# Patient Record
Sex: Female | Born: 1960
Health system: Southern US, Community
[De-identification: ages and names within clinical notes are randomized; demographics above are authoritative.]

## PROBLEM LIST (undated history)

## (undated) DIAGNOSIS — K648 Other hemorrhoids: Secondary | ICD-10-CM

## (undated) DIAGNOSIS — H9190 Unspecified hearing loss, unspecified ear: Secondary | ICD-10-CM

## (undated) HISTORY — DX: Unspecified hearing loss, unspecified ear: H91.90

## (undated) HISTORY — DX: Other hemorrhoids: K64.8

---

## 1999-06-03 ENCOUNTER — Other Ambulatory Visit: Admission: RE | Admit: 1999-06-03 | Discharge: 1999-06-03 | Payer: Self-pay | Admitting: Gynecology

## 2000-06-22 ENCOUNTER — Other Ambulatory Visit: Admission: RE | Admit: 2000-06-22 | Discharge: 2000-06-22 | Payer: Self-pay | Admitting: Gynecology

## 2001-06-24 ENCOUNTER — Other Ambulatory Visit: Admission: RE | Admit: 2001-06-24 | Discharge: 2001-06-24 | Payer: Self-pay | Admitting: Gynecology

## 2002-07-05 ENCOUNTER — Other Ambulatory Visit: Admission: RE | Admit: 2002-07-05 | Discharge: 2002-07-05 | Payer: Self-pay | Admitting: Gynecology

## 2003-08-01 ENCOUNTER — Other Ambulatory Visit: Admission: RE | Admit: 2003-08-01 | Discharge: 2003-08-01 | Payer: Self-pay | Admitting: Gynecology

## 2004-08-01 ENCOUNTER — Other Ambulatory Visit: Admission: RE | Admit: 2004-08-01 | Discharge: 2004-08-01 | Payer: Self-pay | Admitting: Gynecology

## 2005-09-01 ENCOUNTER — Other Ambulatory Visit: Admission: RE | Admit: 2005-09-01 | Discharge: 2005-09-01 | Payer: Self-pay | Admitting: Gynecology

## 2008-08-27 ENCOUNTER — Emergency Department (HOSPITAL_COMMUNITY): Admission: EM | Admit: 2008-08-27 | Discharge: 2008-08-27 | Payer: Self-pay | Admitting: Emergency Medicine
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
## 2013-10-04 ENCOUNTER — Other Ambulatory Visit: Payer: 59

## 2013-10-04 DIAGNOSIS — Z0000 Encounter for general adult medical examination without abnormal findings: Secondary | ICD-10-CM

## 2013-10-04 LAB — LIPID PANEL
Cholesterol: 191 mg/dL (ref 0–200)
Triglycerides: 73 mg/dL (ref <150)

## 2013-10-04 LAB — CBC WITH DIFFERENTIAL/PLATELET
Basophils Absolute: 0 10*3/uL (ref 0.0–0.1)
Eosinophils Relative: 4 % (ref 0–5)
HCT: 42.1 % (ref 36.0–46.0)
Lymphocytes Relative: 39 % (ref 12–46)
Lymphs Abs: 2.3 10*3/uL (ref 0.7–4.0)
MCV: 90.9 fL (ref 78.0–100.0)
Monocytes Absolute: 0.3 10*3/uL (ref 0.1–1.0)
Neutro Abs: 3 10*3/uL (ref 1.7–7.7)
RBC: 4.63 MIL/uL (ref 3.87–5.11)
RDW: 14.3 % (ref 11.5–15.5)
WBC: 5.9 10*3/uL (ref 4.0–10.5)

## 2013-10-04 LAB — COMPLETE METABOLIC PANEL WITH GFR
Albumin: 4.2 g/dL (ref 3.5–5.2)
BUN: 13 mg/dL (ref 6–23)
CO2: 30 mEq/L (ref 19–32)
Calcium: 9.6 mg/dL (ref 8.4–10.5)
Chloride: 103 mEq/L (ref 96–112)
Creat: 0.65 mg/dL (ref 0.50–1.10)
GFR, Est African American: >89 mL/min
GFR, Est Non African American: >89 mL/min
Glucose, Bld: 90 mg/dL (ref 70–99)
Potassium: 4.8 mEq/L (ref 3.5–5.3)

## 2013-10-05 LAB — VITAMIN D 25 HYDROXY (VIT D DEFICIENCY, FRACTURES): Vit D, 25-Hydroxy: 46 ng/mL (ref 30–89)

## 2013-10-10 ENCOUNTER — Ambulatory Visit (INDEPENDENT_AMBULATORY_CARE_PROVIDER_SITE_OTHER): Payer: 59 | Admitting: Family Medicine

## 2013-10-10 ENCOUNTER — Encounter: Payer: Self-pay | Admitting: Family Medicine

## 2013-10-10 VITALS — BP 100/60 | HR 84 | Temp 97.0°F | Resp 18 | Ht 64.0 in | Wt 132.0 lb

## 2013-10-10 DIAGNOSIS — K648 Other hemorrhoids: Secondary | ICD-10-CM | POA: Insufficient documentation

## 2013-10-10 DIAGNOSIS — H9190 Unspecified hearing loss, unspecified ear: Secondary | ICD-10-CM | POA: Insufficient documentation

## 2013-10-10 DIAGNOSIS — Z0000 Encounter for general adult medical examination without abnormal findings: Secondary | ICD-10-CM

## 2013-10-10 NOTE — Progress Notes (Signed)
Subjective:    Patient ID: Chelsea Richmond, female    DOB: June 27, 1961, 52 y.o.   MRN: [id number]  HPI Patient is here for complete physical exam. She has no medical concerns. She sees a gynecologist in [place]. He performs her mammogram, breast exam, and Pap smear. She will have this done later this month. Her most recent lab work is listed below: Lab on 10/04/2013  Component Date Value Range Status  . Sodium 10/04/2013 143  135 - 145 mEq/L Final  . Potassium 10/04/2013 4.8  3.5 - 5.3 mEq/L Final  . Chloride 10/04/2013 103  96 - 112 mEq/L Final  . CO2 10/04/2013 30  19 - 32 mEq/L Final  . Glucose, Bld 10/04/2013 90  70 - 99 mg/dL Final  . BUN 14/78/2956 13  6 - 23 mg/dL Final  . Creat 21/30/8657 0.65  0.50 - 1.10 mg/dL Final  . Total Bilirubin 10/04/2013 0.5  0.3 - 1.2 mg/dL Final  . Alkaline Phosphatase 10/04/2013 82  39 - 117 U/L Final  . AST 10/04/2013 22  0 - 37 U/L Final  . ALT 10/04/2013 22  0 - 35 U/L Final  . Total Protein 10/04/2013 6.9  6.0 - 8.3 g/dL Final  . Albumin 84/69/6295 4.2  3.5 - 5.2 g/dL Final  . Calcium 28/41/3244 9.6  8.4 - 10.5 mg/dL Final  . GFR, Est African American 10/04/2013 >89   Final  . GFR, Est Non African American 10/04/2013 >89   Final   Comment:                            The estimated GFR is a calculation valid for adults (>=57 years old)                          that uses the CKD-EPI algorithm to adjust for age and sex. It is                            not to be used for children, pregnant women, hospitalized patients,                             patients on dialysis, or with rapidly changing kidney function.                          According to the NKDEP, eGFR >89 is normal, 60-89 shows mild                          impairment, 30-59 shows moderate impairment, 15-29 shows severe                          impairment and <15 is ESRD.                             Marland Kitchen Cholesterol 10/04/2013 191  0 - 200 mg/dL Final   Comment: ATP III Classification:                                < 200        mg/dL  Desirable                               200 - 239     mg/dL        Borderline High                               >= 240        mg/dL        High                             . Triglycerides 10/04/2013 73  <150 mg/dL Final  . HDL 45/40/9811 69  >39 mg/dL Final  . Total CHOL/HDL Ratio 10/04/2013 2.8   Final  . VLDL 10/04/2013 15  0 - 40 mg/dL Final  . LDL Cholesterol 10/04/2013 107* 0 - 99 mg/dL Final   Comment:                            Total Cholesterol/HDL Ratio:CHD Risk                                                 Coronary Heart Disease Risk Table                                                                 Men       Women                                   1/2 Average Risk              3.4        3.3                                       Average Risk              5.0        4.4                                    2X Average Risk              9.6        7.1                                    3X Average Risk             23.4       11.0                          Use the calculated Patient  Ratio above and the CHD Risk table                           to determine the patient's CHD Risk.                          ATP III Classification (LDL):                                < 100        mg/dL         Optimal                               100 - 129     mg/dL         Near or Above Optimal                               130 - 159     mg/dL         Borderline High                               160 - 189     mg/dL         High                                > 190        mg/dL         Very High                             . TSH 10/04/2013 1.205  0.350 - 4.500 uIU/mL Final  . Vit D, 25-Hydroxy 10/04/2013 46  30 - 89 ng/mL Final   Comment: This assay accurately quantifies Vitamin D, which is the sum of the                          25-Hydroxy forms of Vitamin D2 and D3.  Studies have shown that the                          optimum concentration  of 25-Hydroxy Vitamin D is 30 ng/mL or higher.                           Concentrations of Vitamin D between 20 and 29 ng/mL are considered to                          be insufficient and concentrations less than 20 ng/mL are considered                          to be deficient for Vitamin D.  . WBC 10/04/2013 5.9  4.0 - 10.5 K/uL Final  . RBC 10/04/2013 4.63  3.87 - 5.11 MIL/uL Final  . Hemoglobin 10/04/2013 14.3  12.0 - 15.0 g/dL Final  . HCT 16/09/9603 42.1  36.0 -  46.0 % Final  . MCV 10/04/2013 90.9  78.0 - 100.0 fL Final  . MCH 10/04/2013 30.9  26.0 - 34.0 pg Final  . MCHC 10/04/2013 34.0  30.0 - 36.0 g/dL Final  . RDW 11/91/4782 14.3  11.5 - 15.5 % Final  . Platelets 10/04/2013 254  150 - 400 K/uL Final  . Neutrophils Relative % 10/04/2013 51  43 - 77 % Final  . Neutro Abs 10/04/2013 3.0  1.7 - 7.7 K/uL Final  . Lymphocytes Relative 10/04/2013 39  12 - 46 % Final  . Lymphs Abs 10/04/2013 2.3  0.7 - 4.0 K/uL Final  . Monocytes Relative 10/04/2013 5  3 - 12 % Final  . Monocytes Absolute 10/04/2013 0.3  0.1 - 1.0 K/uL Final  . Eosinophils Relative 10/04/2013 4  0 - 5 % Final  . Eosinophils Absolute 10/04/2013 0.2  0.0 - 0.7 K/uL Final  . Basophils Relative 10/04/2013 1  0 - 1 % Final  . Basophils Absolute 10/04/2013 0.0  0.0 - 0.1 K/uL Final  . Smear Review 10/04/2013 Criteria for review not met   Final   She had a colonoscopy in 2012 which was completely normal. Her Pap smear and mammogram are done yearly and are due in November. Her tetanus shot was in 2009. She had a flu shot at work earlier this year. Past Medical History  Diagnosis Date  . Internal hemorrhoid   . Hard of hearing    Past Surgical History  Procedure Laterality Date  . Cesarean section      x 2   Current outpatient prescriptions:aspirin 81 MG chewable tablet, Chew 81 mg by mouth daily., Disp: , Rfl: ;  fish oil-omega-3 fatty acids 1000 MG capsule, Take 2 g by mouth daily., Disp: , Rfl: ;  Multiple Vitamin  (MULTIVITAMIN) tablet, Take 1 tablet by mouth daily., Disp: , Rfl:  Allergies  Allergen Reactions  . Penicillins    History   Social History  . Marital Status: Married    Spouse Name: N/A    Number of Children: N/A  . Years of Education: N/A   Occupational History  . Not on file.   Social History Main Topics  . Smoking status: Never Smoker   . Smokeless tobacco: Never Used  . Alcohol Use: No  . Drug Use: No  . Sexual Activity: Yes     Comment: Married to Calvin.  Daughter in 4th year of medical school.   Other Topics Concern  . Not on file   Social History Narrative  . No narrative on file   Family History  Problem Relation Age of Onset  . Colon polyps Mother   . Hyperlipidemia Father   . Heart disease Father   . Hyperlipidemia Sister   . Hyperlipidemia Brother   . CVA Brother       Review of Systems  All other systems reviewed and are negative.       Objective:   Physical Exam  Vitals reviewed. Constitutional: She is oriented to person, place, and time. She appears well-developed and well-nourished. No distress.  HENT:  Head: Normocephalic and atraumatic.  Right Ear: External ear normal.  Left Ear: External ear normal.  Nose: Nose normal.  Mouth/Throat: Oropharynx is clear and moist. No oropharyngeal exudate.  Eyes: Conjunctivae and EOM are normal. Pupils are equal, round, and reactive to light. Right eye exhibits no discharge. Left eye exhibits no discharge. No scleral icterus.  Neck: Normal range of motion. Neck supple. No  JVD present. No tracheal deviation present. No thyromegaly present.  Cardiovascular: Normal rate, regular rhythm, normal heart sounds and intact distal pulses.  Exam reveals no gallop and no friction rub.   No murmur heard. Pulmonary/Chest: Effort normal and breath sounds normal. No stridor. No respiratory distress. She has no wheezes. She has no rales. She exhibits no tenderness.  Abdominal: Soft. Bowel sounds are normal. She  exhibits no distension and no mass. There is no tenderness. There is no rebound and no guarding.  Musculoskeletal: Normal range of motion. She exhibits no edema and no tenderness.  Lymphadenopathy:    She has no cervical adenopathy.  Neurological: She is alert and oriented to person, place, and time. She has normal reflexes. She displays normal reflexes. No cranial nerve deficit. She exhibits normal muscle tone. Coordination normal.  Skin: Skin is warm. No rash noted. She is not diaphoretic. No erythema. No pallor.  Psychiatric: She has a normal mood and affect. Her behavior is normal. Judgment and thought content normal.          Assessment & Plan:  1. Routine general medical examination at a health care facility Physical exam is completely normal.  I will defer her breast exam, Pap smear, and mammogram to her gynecologist. Her lab work is outstanding. Her immunizations and cancer screening are up to date. Regular anticipatory guidance is provided. Her BMI is 23 and normal. She appears to well-balanced diet and gets regular exercise. Followup in one year or as needed.

## 2014-10-19 ENCOUNTER — Other Ambulatory Visit: Payer: 59

## 2014-10-19 DIAGNOSIS — Z0000 Encounter for general adult medical examination without abnormal findings: Secondary | ICD-10-CM

## 2014-10-19 LAB — COMPLETE METABOLIC PANEL WITH GFR
ALK PHOS: 86 U/L (ref 39–117)
ALT: 16 U/L (ref 0–35)
AST: 18 U/L (ref 0–37)
Albumin: 4 g/dL (ref 3.5–5.2)
BILIRUBIN TOTAL: 0.5 mg/dL (ref 0.2–1.2)
BUN: 10 mg/dL (ref 6–23)
CO2: 30 mEq/L (ref 19–32)
CREATININE: 0.63 mg/dL (ref 0.50–1.10)
Calcium: 9.3 mg/dL (ref 8.4–10.5)
Chloride: 103 mEq/L (ref 96–112)
GFR, Est African American: >89 mL/min
GFR, Est Non African American: >89 mL/min
Glucose, Bld: 79 mg/dL (ref 70–99)
Potassium: 4.1 mEq/L (ref 3.5–5.3)
Sodium: 143 mEq/L (ref 135–145)
Total Protein: 6.7 g/dL (ref 6.0–8.3)

## 2014-10-19 LAB — LIPID PANEL
Cholesterol: 194 mg/dL (ref 0–200)
HDL: 70 mg/dL (ref >39)
LDL CALC: 109 mg/dL — AB (ref 0–99)
TRIGLYCERIDES: 73 mg/dL (ref <150)
Total CHOL/HDL Ratio: 2.8 Ratio
VLDL: 15 mg/dL (ref 0–40)

## 2014-10-19 LAB — CBC WITH DIFFERENTIAL/PLATELET
BASOS ABS: 0.1 10*3/uL (ref 0.0–0.1)
BASOS PCT: 1 % (ref 0–1)
EOS ABS: 0.2 10*3/uL (ref 0.0–0.7)
EOS PCT: 4 % (ref 0–5)
HCT: 41.9 % (ref 36.0–46.0)
Hemoglobin: 13.8 g/dL (ref 12.0–15.0)
LYMPHS PCT: 40 % (ref 12–46)
Lymphs Abs: 2.4 10*3/uL (ref 0.7–4.0)
MCH: 30.1 pg (ref 26.0–34.0)
MCHC: 32.9 g/dL (ref 30.0–36.0)
MCV: 91.3 fL (ref 78.0–100.0)
Monocytes Absolute: 0.4 10*3/uL (ref 0.1–1.0)
Monocytes Relative: 7 % (ref 3–12)
Neutro Abs: 2.9 10*3/uL (ref 1.7–7.7)
Neutrophils Relative %: 48 % (ref 43–77)
PLATELETS: 245 10*3/uL (ref 150–400)
RBC: 4.59 MIL/uL (ref 3.87–5.11)
RDW: 14.1 % (ref 11.5–15.5)
WBC: 6 10*3/uL (ref 4.0–10.5)

## 2014-10-19 LAB — TSH: TSH: 0.669 u[IU]/mL (ref 0.350–4.500)

## 2014-11-06 ENCOUNTER — Encounter: Payer: Self-pay | Admitting: Family Medicine

## 2014-11-06 ENCOUNTER — Ambulatory Visit (INDEPENDENT_AMBULATORY_CARE_PROVIDER_SITE_OTHER): Payer: 59 | Admitting: Family Medicine

## 2014-11-06 VITALS — BP 120/80 | HR 68 | Temp 98.0°F | Resp 14 | Ht 64.0 in | Wt 143.0 lb

## 2014-11-06 DIAGNOSIS — Z0000 Encounter for general adult medical examination without abnormal findings: Secondary | ICD-10-CM

## 2014-11-06 NOTE — Progress Notes (Signed)
Subjective:    Patient ID: Chelsea Richmond, female    DOB: [birth year], 53 y.o.   MRN: [id number]  HPI Patient is a very pleasant 53 year old white female who is here today for complete physical exam. She has her mammogram, Pap smear, and breast exam performed at her gynecologist. Her colonoscopy was performed 3 years ago. She received her flu shot at work. Overall her review of systems is negative. She denies any medical complaints or concerns. She is unsure of the date of her last tetanus shot. Her most recent lab work as listed below Lab on 10/19/2014  Component Date Value Ref Range Status  . WBC 10/19/2014 6.0  4.0 - 10.5 K/uL Final  . RBC 10/19/2014 4.59  3.87 - 5.11 MIL/uL Final  . Hemoglobin 10/19/2014 13.8  12.0 - 15.0 g/dL Final  . HCT 10/19/2014 41.9  36.0 - 46.0 % Final  . MCV 10/19/2014 91.3  78.0 - 100.0 fL Final  . MCH 10/19/2014 30.1  26.0 - 34.0 pg Final  . MCHC 10/19/2014 32.9  30.0 - 36.0 g/dL Final  . RDW 10/19/2014 14.1  11.5 - 15.5 % Final  . Platelets 10/19/2014 245  150 - 400 K/uL Final  . Neutrophils Relative % 10/19/2014 48  43 - 77 % Final  . Neutro Abs 10/19/2014 2.9  1.7 - 7.7 K/uL Final  . Lymphocytes Relative 10/19/2014 40  12 - 46 % Final  . Lymphs Abs 10/19/2014 2.4  0.7 - 4.0 K/uL Final  . Monocytes Relative 10/19/2014 7  3 - 12 % Final  . Monocytes Absolute 10/19/2014 0.4  0.1 - 1.0 K/uL Final  . Eosinophils Relative 10/19/2014 4  0 - 5 % Final  . Eosinophils Absolute 10/19/2014 0.2  0.0 - 0.7 K/uL Final  . Basophils Relative 10/19/2014 1  0 - 1 % Final  . Basophils Absolute 10/19/2014 0.1  0.0 - 0.1 K/uL Final  . Smear Review 10/19/2014 Criteria for review not met   Final  . Sodium 10/19/2014 143  135 - 145 mEq/L Final  . Potassium 10/19/2014 4.1  3.5 - 5.3 mEq/L Final  . Chloride 10/19/2014 103  96 - 112 mEq/L Final  . CO2 10/19/2014 30  19 - 32 mEq/L Final  . Glucose, Bld 10/19/2014 79  70 - 99 mg/dL Final  . BUN 10/19/2014 10  6 - 23 mg/dL Final    . Creat 10/19/2014 0.63  0.50 - 1.10 mg/dL Final  . Total Bilirubin 10/19/2014 0.5  0.2 - 1.2 mg/dL Final  . Alkaline Phosphatase 10/19/2014 86  39 - 117 U/L Final  . AST 10/19/2014 18  0 - 37 U/L Final  . ALT 10/19/2014 16  0 - 35 U/L Final  . Total Protein 10/19/2014 6.7  6.0 - 8.3 g/dL Final  . Albumin 10/19/2014 4.0  3.5 - 5.2 g/dL Final  . Calcium 10/19/2014 9.3  8.4 - 10.5 mg/dL Final  . GFR, Est African American 10/19/2014 >89   Final  . GFR, Est Non African American 10/19/2014 >89   Final   Comment:   The estimated GFR is a calculation valid for adults (>=19 years old) that uses the CKD-EPI algorithm to adjust for age and sex. It is   not to be used for children, pregnant women, hospitalized patients,    patients on dialysis, or with rapidly changing kidney function. According to the NKDEP, eGFR >89 is normal, 60-89 shows mild impairment, 30-59 shows moderate impairment, 15-29 shows severe impairment  and <15 is ESRD.     Marland Kitchen Cholesterol 10/19/2014 194  0 - 200 mg/dL Final   Comment: ATP III Classification:       < 200        mg/dL        Desirable      200 - 239     mg/dL        Borderline High      >= 240        mg/dL        High     . Triglycerides 10/19/2014 73  <150 mg/dL Final  . HDL 10/19/2014 70  >39 mg/dL Final  . Total CHOL/HDL Ratio 10/19/2014 2.8   Final  . VLDL 10/19/2014 15  0 - 40 mg/dL Final  . LDL Cholesterol 10/19/2014 109* 0 - 99 mg/dL Final   Comment:   Total Cholesterol/HDL Ratio:CHD Risk                        Coronary Heart Disease Risk Table                                        Men       Women          1/2 Average Risk              3.4        3.3              Average Risk              5.0        4.4           2X Average Risk              9.6        7.1           3X Average Risk             23.4       11.0 Use the calculated Patient Ratio above and the CHD Risk table  to determine the patient's CHD Risk. ATP III Classification (LDL):       <  100        mg/dL         Optimal      100 - 129     mg/dL         Near or Above Optimal      130 - 159     mg/dL         Borderline High      160 - 189     mg/dL         High       > 190        mg/dL         Very High     . TSH 10/19/2014 0.669  0.350 - 4.500 uIU/mL Final   Past Medical History  Diagnosis Date  . Internal hemorrhoid   . Hard of hearing    Past Surgical History  Procedure Laterality Date  . Cesarean section      x 2   Current Outpatient Prescriptions on File Prior to Visit  Medication Sig Dispense Refill  . aspirin 81 MG chewable tablet Chew 81 mg by mouth daily.    . fish oil-omega-3  fatty acids 1000 MG capsule Take 2 g by mouth daily.    . Multiple Vitamin (MULTIVITAMIN) tablet Take 1 tablet by mouth daily.     No current facility-administered medications on file prior to visit.   Allergies  Allergen Reactions  . Penicillins    History   Social History  . Marital Status: Married    Spouse Name: N/A    Number of Children: N/A  . Years of Education: N/A   Occupational History  . Not on file.   Social History Main Topics  . Smoking status: Never Smoker   . Smokeless tobacco: Never Used  . Alcohol Use: No  . Drug Use: No  . Sexual Activity: Yes     Comment: Married to Arm[place].  Daughter in 4th year of medical school.   Other Topics Concern  . Not on file   Social History Narrative   daughter has actually graduated medical school and is in her intern year at [employer] at the [employer]. Family History  Problem Relation Age of Onset  . Colon polyps Mother   . Hyperlipidemia Father   . Heart disease Father   . Hyperlipidemia Sister   . Hyperlipidemia Brother   . CVA Brother       Review of Systems  All other systems reviewed and are negative.      Objective:   Physical Exam  Constitutional: She is oriented to person, place, and time. She appears well-developed and well-nourished. No distress.  HENT:  Head: Normocephalic and atraumatic.    Right Ear: External ear normal.  Left Ear: External ear normal.  Nose: Nose normal.  Mouth/Throat: Oropharynx is clear and moist. No oropharyngeal exudate.  Eyes: Conjunctivae and EOM are normal. Pupils are equal, round, and reactive to light. Right eye exhibits no discharge. Left eye exhibits no discharge. No scleral icterus.  Neck: Normal range of motion. Neck supple. No JVD present. No thyromegaly present.  Cardiovascular: Normal rate, regular rhythm and normal heart sounds.  Exam reveals no gallop and no friction rub.   No murmur heard. Pulmonary/Chest: Effort normal and breath sounds normal. No stridor. No respiratory distress. She has no wheezes. She has no rales. She exhibits no tenderness.  Abdominal: Soft. Bowel sounds are normal. She exhibits no distension and no mass. There is no tenderness. There is no rebound and no guarding.  Musculoskeletal: Normal range of motion. She exhibits no edema or tenderness.  Lymphadenopathy:    She has no cervical adenopathy.  Neurological: She is alert and oriented to person, place, and time. She has normal reflexes. She displays normal reflexes. No cranial nerve deficit. She exhibits normal muscle tone. Coordination normal.  Skin: Skin is warm. No rash noted. She is not diaphoretic. No erythema. No pallor.  Psychiatric: She has a normal mood and affect. Her behavior is normal. Judgment and thought content normal.  Vitals reviewed.         Assessment & Plan:  Routine general medical examination at a health care facility  Patient's physical exam is completely normal. Her cancer screening is up-to-date. Her immunizations are up-to-date. I recommend a Tdap 10 years after her last tetanus shot. She will check her records at home. I have also recommended 1200 mg a day of calcium and 1,000 units a day of vitamin D.  Recheck in 1 year or as needed.

## 2016-07-21 ENCOUNTER — Other Ambulatory Visit: Payer: 59

## 2016-07-21 DIAGNOSIS — Z0000 Encounter for general adult medical examination without abnormal findings: Secondary | ICD-10-CM

## 2016-07-21 LAB — CBC WITH DIFFERENTIAL/PLATELET
BASOS PCT: 1 %
Basophils Absolute: 59 cells/uL (ref 0–200)
EOS ABS: 177 {cells}/uL (ref 15–500)
Eosinophils Relative: 3 %
HCT: 41 % (ref 35.0–45.0)
Hemoglobin: 13.6 g/dL (ref 12.0–15.0)
LYMPHS PCT: 43 %
Lymphs Abs: 2537 cells/uL (ref 850–3900)
MCH: 30.6 pg (ref 27.0–33.0)
MCHC: 33.2 g/dL (ref 32.0–36.0)
MCV: 92.3 fL (ref 80.0–100.0)
MONOS PCT: 6 %
MPV: 8.8 fL (ref 7.5–12.5)
Monocytes Absolute: 354 cells/uL (ref 200–950)
NEUTROS PCT: 47 %
Neutro Abs: 2773 cells/uL (ref 1500–7800)
PLATELETS: 255 10*3/uL (ref 140–400)
RBC: 4.44 MIL/uL (ref 3.80–5.10)
RDW: 13.9 % (ref 11.0–15.0)
WBC: 5.9 10*3/uL (ref 3.8–10.8)

## 2016-07-21 LAB — LIPID PANEL
CHOL/HDL RATIO: 3 ratio (ref <=5.0)
CHOLESTEROL: 196 mg/dL (ref 125–200)
HDL: 65 mg/dL (ref >=46)
LDL Cholesterol: 117 mg/dL (ref <130)
Triglycerides: 69 mg/dL (ref <150)
VLDL: 14 mg/dL (ref <30)

## 2016-07-21 LAB — COMPLETE METABOLIC PANEL WITH GFR
ALT: 15 U/L (ref 6–29)
AST: 17 U/L (ref 10–35)
Albumin: 4.1 g/dL (ref 3.6–5.1)
Alkaline Phosphatase: 71 U/L (ref 33–130)
BUN: 13 mg/dL (ref 7–25)
CHLORIDE: 106 mmol/L (ref 98–110)
CO2: 27 mmol/L (ref 20–31)
CREATININE: 0.67 mg/dL (ref 0.50–1.05)
Calcium: 9.2 mg/dL (ref 8.6–10.4)
GFR, Est African American: >89 mL/min (ref >=60)
GFR, Est Non African American: >89 mL/min (ref >=60)
Glucose, Bld: 92 mg/dL (ref 70–99)
Potassium: 3.8 mmol/L (ref 3.5–5.3)
Sodium: 142 mmol/L (ref 135–146)
Total Bilirubin: 0.4 mg/dL (ref 0.2–1.2)
Total Protein: 6.5 g/dL (ref 6.1–8.1)

## 2016-07-21 LAB — TSH: TSH: 1.45 mIU/L

## 2016-07-24 ENCOUNTER — Ambulatory Visit (INDEPENDENT_AMBULATORY_CARE_PROVIDER_SITE_OTHER): Payer: 59 | Admitting: Family Medicine

## 2016-07-24 VITALS — BP 100/64 | HR 62 | Temp 97.9°F | Resp 14 | Ht 64.0 in | Wt 143.0 lb

## 2016-07-24 DIAGNOSIS — Z23 Encounter for immunization: Secondary | ICD-10-CM

## 2016-07-24 DIAGNOSIS — Z0000 Encounter for general adult medical examination without abnormal findings: Secondary | ICD-10-CM

## 2016-07-24 NOTE — Progress Notes (Signed)
Subjective:    Patient ID: Chelsea Richmond, female    DOB: 07-Apr-1961, 55 y.o.   MRN: [id number]  HPI Patient is a very pleasant 55 year old white female who is here today for complete physical exam. She has her mammogram, Pap smear, and breast exam performed at her gynecologist. Her colonoscopy was performed in 2012. She receives her flu shot at work.  Due for Tdap. Overall her review of systems is negative. She denies any medical complaints or concerns. Her most recent lab work as listed below Lab on 07/21/2016  Component Date Value Ref Range Status  . Sodium 07/21/2016 142  135 - 146 mmol/L Final  . Potassium 07/21/2016 3.8  3.5 - 5.3 mmol/L Final  . Chloride 07/21/2016 106  98 - 110 mmol/L Final  . CO2 07/21/2016 27  20 - 31 mmol/L Final  . Glucose, Bld 07/21/2016 92  70 - 99 mg/dL Final  . BUN 07/21/2016 13  7 - 25 mg/dL Final  . Creat 07/21/2016 0.67  0.50 - 1.05 mg/dL Final   Comment:   For patients > or = 55 years of age: The upper reference limit for Creatinine is approximately 13% higher for people identified as African-American.     . Total Bilirubin 07/21/2016 0.4  0.2 - 1.2 mg/dL Final  . Alkaline Phosphatase 07/21/2016 71  33 - 130 U/L Final  . AST 07/21/2016 17  10 - 35 U/L Final  . ALT 07/21/2016 15  6 - 29 U/L Final  . Total Protein 07/21/2016 6.5  6.1 - 8.1 g/dL Final  . Albumin 07/21/2016 4.1  3.6 - 5.1 g/dL Final  . Calcium 07/21/2016 9.2  8.6 - 10.4 mg/dL Final  . GFR, Est African American 07/21/2016 >89  >=60 mL/min Final  . GFR, Est Non African American 07/21/2016 >89  >=60 mL/min Final  . TSH 07/21/2016 1.45  mIU/L Final   Comment:   Reference Range   > or = 20 Years  0.40-4.50   Pregnancy Range First trimester  0.26-2.66 Second trimester 0.55-2.73 Third trimester  0.43-2.91     . Cholesterol 07/21/2016 196  125 - 200 mg/dL Final  . Triglycerides 07/21/2016 69  <150 mg/dL Final  . HDL 07/21/2016 65  >=46 mg/dL Final  . Total CHOL/HDL Ratio  07/21/2016 3.0  <=5.0 Ratio Final  . VLDL 07/21/2016 14  <30 mg/dL Final  . LDL Cholesterol 07/21/2016 117  <130 mg/dL Final   Comment:   Total Cholesterol/HDL Ratio:CHD Risk                        Coronary Heart Disease Risk Table                                        Men       Women          1/2 Average Risk              3.4        3.3              Average Risk              5.0        4.4           2X Average Risk  9.6        7.1           3X Average Risk             23.4       11.0 Use the calculated Patient Ratio above and the CHD Risk table  to determine the patient's CHD Risk.   . WBC 07/21/2016 5.9  3.8 - 10.8 K/uL Final  . RBC 07/21/2016 4.44  3.80 - 5.10 MIL/uL Final  . Hemoglobin 07/21/2016 13.6  12.0 - 15.0 g/dL Final  . HCT 07/21/2016 41.0  35.0 - 45.0 % Final  . MCV 07/21/2016 92.3  80.0 - 100.0 fL Final  . MCH 07/21/2016 30.6  27.0 - 33.0 pg Final  . MCHC 07/21/2016 33.2  32.0 - 36.0 g/dL Final  . RDW 07/21/2016 13.9  11.0 - 15.0 % Final  . Platelets 07/21/2016 255  140 - 400 K/uL Final  . MPV 07/21/2016 8.8  7.5 - 12.5 fL Final  . Neutro Abs 07/21/2016 2773  1,500 - 7,800 cells/uL Final  . Lymphs Abs 07/21/2016 2537  850 - 3,900 cells/uL Final  . Monocytes Absolute 07/21/2016 354  200 - 950 cells/uL Final  . Eosinophils Absolute 07/21/2016 177  15 - 500 cells/uL Final  . Basophils Absolute 07/21/2016 59  0 - 200 cells/uL Final  . Neutrophils Relative % 07/21/2016 47  % Final  . Lymphocytes Relative 07/21/2016 43  % Final  . Monocytes Relative 07/21/2016 6  % Final  . Eosinophils Relative 07/21/2016 3  % Final  . Basophils Relative 07/21/2016 1  % Final  . Smear Review 07/21/2016 Criteria for review not met   Final   Past Medical History:  Diagnosis Date  . Hard of hearing   . Internal hemorrhoid    Past Surgical History:  Procedure Laterality Date  . CESAREAN SECTION     x 2   Current Outpatient Prescriptions on File Prior to Visit    Medication Sig Dispense Refill  . aspirin 81 MG chewable tablet Chew 81 mg by mouth daily.    . fish oil-omega-3 fatty acids 1000 MG capsule Take 2 g by mouth daily.    . Multiple Vitamin (MULTIVITAMIN) tablet Take 1 tablet by mouth daily.     No current facility-administered medications on file prior to visit.    Allergies  Allergen Reactions  . Penicillins    Social History   Social History  . Marital status: Married    Spouse name: N/A  . Number of children: N/A  . Years of education: N/A   Occupational History  . Not on file.   Social History Main Topics  . Smoking status: Never Smoker  . Smokeless tobacco: Never Used  . Alcohol use No  . Drug use: No  . Sexual activity: Yes     Comment: Married to [place].  Daughter in 4th year of medical school.   Other Topics Concern  . Not on file   Social History Narrative  . No narrative on file   daughter has actually graduated medical school and is in her 3rd year at [employer] [facility] the [employer]. Family History  Problem Relation Age of Onset  . Colon polyps Mother   . Hyperlipidemia Father   . Heart disease Father   . Hyperlipidemia Sister   . Hyperlipidemia Brother   . CVA Brother       Review of Systems  All other systems reviewed and are negative.  Objective:   Physical Exam  Constitutional: She is oriented to person, place, and time. She appears well-developed and well-nourished. No distress.  HENT:  Head: Normocephalic and atraumatic.  Right Ear: External ear normal.  Left Ear: External ear normal.  Nose: Nose normal.  Mouth/Throat: Oropharynx is clear and moist. No oropharyngeal exudate.  Eyes: Conjunctivae and EOM are normal. Pupils are equal, round, and reactive to light. Right eye exhibits no discharge. Left eye exhibits no discharge. No scleral icterus.  Neck: Normal range of motion. Neck supple. No JVD present. No thyromegaly present.  Cardiovascular: Normal rate, regular rhythm and normal heart  sounds.  Exam reveals no gallop and no friction rub.   No murmur heard. Pulmonary/Chest: Effort normal and breath sounds normal. No stridor. No respiratory distress. She has no wheezes. She has no rales. She exhibits no tenderness.  Abdominal: Soft. Bowel sounds are normal. She exhibits no distension and no mass. There is no tenderness. There is no rebound and no guarding.  Musculoskeletal: Normal range of motion. She exhibits no edema or tenderness.  Lymphadenopathy:    She has no cervical adenopathy.  Neurological: She is alert and oriented to person, place, and time. She has normal reflexes. No cranial nerve deficit. She exhibits normal muscle tone. Coordination normal.  Skin: Skin is warm. No rash noted. She is not diaphoretic. No erythema. No pallor.  Psychiatric: She has a normal mood and affect. Her behavior is normal. Judgment and thought content normal.  Vitals reviewed.         Assessment & Plan:  Routine general medical examination at a health care facility  Patient's physical exam is completely normal. Her cancer screening is up-to-date. Her immunizations are up-to-date except for Tdap.  She received Tdap today.  Labs are outstanding.  I have also recommended 1200 mg a day of calcium and 1,000 units a day of vitamin D.  Recheck in 1 year or as needed.

## 2016-07-24 NOTE — Addendum Note (Signed)
Addended by: Legrand RamsWILLIS, Arsema Tusing B on: 07/24/2016 09:24 AM   Modules accepted: Orders

## 2016-08-05 LAB — HM PAP SMEAR: HM PAP: NEGATIVE

## 2016-08-05 LAB — HM MAMMOGRAPHY

## 2017-08-03 ENCOUNTER — Other Ambulatory Visit: Payer: Self-pay | Admitting: Family Medicine

## 2017-08-03 DIAGNOSIS — Z0000 Encounter for general adult medical examination without abnormal findings: Secondary | ICD-10-CM

## 2017-08-04 ENCOUNTER — Other Ambulatory Visit: Payer: 59

## 2017-08-04 DIAGNOSIS — Z0000 Encounter for general adult medical examination without abnormal findings: Secondary | ICD-10-CM

## 2017-08-04 LAB — CBC WITH DIFFERENTIAL/PLATELET
BASOS PCT: 1 %
Basophils Absolute: 62 cells/uL (ref 0–200)
EOS PCT: 3 %
Eosinophils Absolute: 186 cells/uL (ref 15–500)
HCT: 42.3 % (ref 35.0–45.0)
Hemoglobin: 13.8 g/dL (ref 12.0–15.0)
LYMPHS PCT: 36 %
Lymphs Abs: 2232 cells/uL (ref 850–3900)
MCH: 30.5 pg (ref 27.0–33.0)
MCHC: 32.6 g/dL (ref 32.0–36.0)
MCV: 93.6 fL (ref 80.0–100.0)
MONO ABS: 310 {cells}/uL (ref 200–950)
MONOS PCT: 5 %
MPV: 8.6 fL (ref 7.5–12.5)
Neutro Abs: 3410 cells/uL (ref 1500–7800)
Neutrophils Relative %: 55 %
PLATELETS: 247 10*3/uL (ref 140–400)
RBC: 4.52 MIL/uL (ref 3.80–5.10)
RDW: 14 % (ref 11.0–15.0)
WBC: 6.2 10*3/uL (ref 3.8–10.8)

## 2017-08-05 LAB — COMPLETE METABOLIC PANEL WITH GFR
ALBUMIN: 4.1 g/dL (ref 3.6–5.1)
ALK PHOS: 82 U/L (ref 33–130)
ALT: 16 U/L (ref 6–29)
AST: 16 U/L (ref 10–35)
BILIRUBIN TOTAL: 0.4 mg/dL (ref 0.2–1.2)
BUN: 15 mg/dL (ref 7–25)
CO2: 26 mmol/L (ref 20–32)
CREATININE: 0.78 mg/dL (ref 0.50–1.05)
Calcium: 9.4 mg/dL (ref 8.6–10.4)
Chloride: 105 mmol/L (ref 98–110)
GFR, EST NON AFRICAN AMERICAN: 85 mL/min (ref >=60)
Glucose, Bld: 95 mg/dL (ref 70–99)
Potassium: 4.4 mmol/L (ref 3.5–5.3)
Sodium: 142 mmol/L (ref 135–146)
TOTAL PROTEIN: 6.5 g/dL (ref 6.1–8.1)

## 2017-08-05 LAB — LIPID PANEL
CHOLESTEROL: 195 mg/dL (ref <200)
HDL: 64 mg/dL (ref >50)
LDL Cholesterol: 116 mg/dL — ABNORMAL HIGH (ref <100)
TRIGLYCERIDES: 77 mg/dL (ref <150)
Total CHOL/HDL Ratio: 3 Ratio (ref <5.0)
VLDL: 15 mg/dL (ref <30)

## 2017-08-11 ENCOUNTER — Ambulatory Visit (INDEPENDENT_AMBULATORY_CARE_PROVIDER_SITE_OTHER): Payer: 59 | Admitting: Family Medicine

## 2017-08-11 ENCOUNTER — Encounter: Payer: Self-pay | Admitting: Family Medicine

## 2017-08-11 VITALS — BP 112/78 | HR 76 | Temp 98.9°F | Resp 16 | Ht 64.0 in | Wt 140.0 lb

## 2017-08-11 DIAGNOSIS — Z0000 Encounter for general adult medical examination without abnormal findings: Secondary | ICD-10-CM | POA: Diagnosis not present

## 2017-08-11 NOTE — Progress Notes (Signed)
Subjective:    Patient ID: Chelsea Richmond, female    DOB: February 15, 1961, 56 y.o.   MRN: [id number]  HPI Patient is a very pleasant 56 year old white female who is here today for complete physical exam. She has her mammogram, Pap smear, and breast exam performed at her gynecologist. Her colonoscopy was performed in 2012. She receives her flu shot at work.  Due for Shingrix.  Tdap given last year.  She denies any medical complaints or concerns. Her most recent lab work as listed below Appointment on 08/04/2017  Component Date Value Ref Range Status  . WBC 08/04/2017 6.2  3.8 - 10.8 K/uL Final  . RBC 08/04/2017 4.52  3.80 - 5.10 MIL/uL Final  . Hemoglobin 08/04/2017 13.8  12.0 - 15.0 g/dL Final  . HCT 08/04/2017 42.3  35.0 - 45.0 % Final  . MCV 08/04/2017 93.6  80.0 - 100.0 fL Final  . MCH 08/04/2017 30.5  27.0 - 33.0 pg Final  . MCHC 08/04/2017 32.6  32.0 - 36.0 g/dL Final  . RDW 08/04/2017 14.0  11.0 - 15.0 % Final  . Platelets 08/04/2017 247  140 - 400 K/uL Final  . MPV 08/04/2017 8.6  7.5 - 12.5 fL Final  . Neutro Abs 08/04/2017 3410  1,500 - 7,800 cells/uL Final  . Lymphs Abs 08/04/2017 2232  850 - 3,900 cells/uL Final  . Monocytes Absolute 08/04/2017 310  200 - 950 cells/uL Final  . Eosinophils Absolute 08/04/2017 186  15 - 500 cells/uL Final  . Basophils Absolute 08/04/2017 62  0 - 200 cells/uL Final  . Neutrophils Relative % 08/04/2017 55  % Final  . Lymphocytes Relative 08/04/2017 36  % Final  . Monocytes Relative 08/04/2017 5  % Final  . Eosinophils Relative 08/04/2017 3  % Final  . Basophils Relative 08/04/2017 1  % Final  . Smear Review 08/04/2017 Criteria for review not met   Final  . Cholesterol 08/04/2017 195  <200 mg/dL Final  . Triglycerides 08/04/2017 77  <150 mg/dL Final  . HDL 08/04/2017 64  >50 mg/dL Final  . Total CHOL/HDL Ratio 08/04/2017 3.0  <5.0 Ratio Final  . VLDL 08/04/2017 15  <30 mg/dL Final  . LDL Cholesterol 08/04/2017 116* <100 mg/dL Final  . Sodium  08/04/2017 142  135 - 146 mmol/L Final  . Potassium 08/04/2017 4.4  3.5 - 5.3 mmol/L Final  . Chloride 08/04/2017 105  98 - 110 mmol/L Final  . CO2 08/04/2017 26  20 - 32 mmol/L Final   Comment: ** Please note change in reference range(s). **     . Glucose, Bld 08/04/2017 95  70 - 99 mg/dL Final  . BUN 08/04/2017 15  7 - 25 mg/dL Final  . Creat 08/04/2017 0.78  0.50 - 1.05 mg/dL Final   Comment:   For patients > or = 56 years of age: The upper reference limit for Creatinine is approximately 13% higher for people identified as African-American.     . Total Bilirubin 08/04/2017 0.4  0.2 - 1.2 mg/dL Final  . Alkaline Phosphatase 08/04/2017 82  33 - 130 U/L Final  . AST 08/04/2017 16  10 - 35 U/L Final  . ALT 08/04/2017 16  6 - 29 U/L Final  . Total Protein 08/04/2017 6.5  6.1 - 8.1 g/dL Final  . Albumin 08/04/2017 4.1  3.6 - 5.1 g/dL Final  . Calcium 08/04/2017 9.4  8.6 - 10.4 mg/dL Final  . GFR, Est African American 08/04/2017 >89  >=  60 mL/min Final  . GFR, Est Non African American 08/04/2017 85  >=60 mL/min Final   Past Medical History:  Diagnosis Date  . Hard of hearing   . Internal hemorrhoid    Past Surgical History:  Procedure Laterality Date  . CESAREAN SECTION     x 2   Current Outpatient Prescriptions on File Prior to Visit  Medication Sig Dispense Refill  . aspirin 81 MG chewable tablet Chew 81 mg by mouth daily.    . fish oil-omega-3 fatty acids 1000 MG capsule Take 2 g by mouth daily.    . Multiple Vitamin (MULTIVITAMIN) tablet Take 1 tablet by mouth daily.     No current facility-administered medications on file prior to visit.    Allergies  Allergen Reactions  . Penicillins    Social History   Social History  . Marital status: Married    Spouse name: N/A  . Number of children: N/A  . Years of education: N/A   Occupational History  . Not on file.   Social History Main Topics  . Smoking status: Never Smoker  . Smokeless tobacco: Never Used  .  Alcohol use No  . Drug use: No  . Sexual activity: Yes     Comment: Married to [place].  Daughter in 4th year of medical school.   Other Topics Concern  . Not on file   Social History Narrative  . No narrative on file  Daughter is MD at [employer] Other daughter is trainer in orthopedics at [employer] Family History  Problem Relation Age of Onset  . Colon polyps Mother   . Hyperlipidemia Father   . Heart disease Father   . Hyperlipidemia Sister   . Hyperlipidemia Brother   . CVA Brother       Review of Systems  All other systems reviewed and are negative.      Objective:   Physical Exam  Constitutional: She is oriented to person, place, and time. She appears well-developed and well-nourished. No distress.  HENT:  Head: Normocephalic and atraumatic.  Right Ear: External ear normal.  Left Ear: External ear normal.  Nose: Nose normal.  Mouth/Throat: Oropharynx is clear and moist. No oropharyngeal exudate.  Eyes: Pupils are equal, round, and reactive to light. Conjunctivae and EOM are normal. Right eye exhibits no discharge. Left eye exhibits no discharge. No scleral icterus.  Neck: Normal range of motion. Neck supple. No JVD present. No thyromegaly present.  Cardiovascular: Normal rate, regular rhythm and normal heart sounds.  Exam reveals no gallop and no friction rub.   No murmur heard. Pulmonary/Chest: Effort normal and breath sounds normal. No stridor. No respiratory distress. She has no wheezes. She has no rales. She exhibits no tenderness.  Abdominal: Soft. Bowel sounds are normal. She exhibits no distension and no mass. There is no tenderness. There is no rebound and no guarding.  Musculoskeletal: Normal range of motion. She exhibits no edema or tenderness.  Lymphadenopathy:    She has no cervical adenopathy.  Neurological: She is alert and oriented to person, place, and time. She has normal reflexes. No cranial nerve deficit. She exhibits normal muscle tone.  Coordination normal.  Skin: Skin is warm. No rash noted. She is not diaphoretic. No erythema. No pallor.  Psychiatric: She has a normal mood and affect. Her behavior is normal. Judgment and thought content normal.  Vitals reviewed.         Assessment & Plan:  Routine general medical examination at a  health care facility  Patient's physical exam is completely normal. Her cancer screening is up-to-date. Her immunizations are up-to-date except for Shingrix which we discussed.  Labs are outstanding.  Recheck in 1 year or as needed.

## 2017-08-24 ENCOUNTER — Encounter: Payer: Self-pay | Admitting: Family Medicine

## 2017-09-17 DIAGNOSIS — Z01419 Encounter for gynecological examination (general) (routine) without abnormal findings: Secondary | ICD-10-CM | POA: Diagnosis not present

## 2017-09-17 DIAGNOSIS — Z124 Encounter for screening for malignant neoplasm of cervix: Secondary | ICD-10-CM | POA: Diagnosis not present

## 2017-09-17 DIAGNOSIS — Z1231 Encounter for screening mammogram for malignant neoplasm of breast: Secondary | ICD-10-CM | POA: Diagnosis not present

## 2017-10-20 DIAGNOSIS — H903 Sensorineural hearing loss, bilateral: Secondary | ICD-10-CM | POA: Diagnosis not present

## 2018-09-27 ENCOUNTER — Other Ambulatory Visit: Payer: 59

## 2018-09-27 DIAGNOSIS — Z0000 Encounter for general adult medical examination without abnormal findings: Secondary | ICD-10-CM | POA: Diagnosis not present

## 2018-09-27 DIAGNOSIS — Z13220 Encounter for screening for lipoid disorders: Secondary | ICD-10-CM

## 2018-09-27 LAB — COMPLETE METABOLIC PANEL WITH GFR
AG Ratio: 1.6 (calc) (ref 1.0–2.5)
ALT: 17 U/L (ref 6–29)
AST: 25 U/L (ref 10–35)
Albumin: 4 g/dL (ref 3.6–5.1)
Alkaline phosphatase (APISO): 76 U/L (ref 33–130)
BUN: 11 mg/dL (ref 7–25)
CALCIUM: 9.5 mg/dL (ref 8.6–10.4)
CO2: 29 mmol/L (ref 20–32)
Chloride: 106 mmol/L (ref 98–110)
Creat: 0.64 mg/dL (ref 0.50–1.05)
GFR, EST AFRICAN AMERICAN: 115 mL/min/{1.73_m2} (ref >=60)
GFR, EST NON AFRICAN AMERICAN: 99 mL/min/{1.73_m2} (ref >=60)
GLUCOSE: 89 mg/dL (ref 65–99)
Globulin: 2.5 g/dL (calc) (ref 1.9–3.7)
Potassium: 4.2 mmol/L (ref 3.5–5.3)
Sodium: 142 mmol/L (ref 135–146)
TOTAL PROTEIN: 6.5 g/dL (ref 6.1–8.1)
Total Bilirubin: 0.6 mg/dL (ref 0.2–1.2)

## 2018-09-27 LAB — CBC WITH DIFFERENTIAL/PLATELET
BASOS PCT: 1.3 %
Basophils Absolute: 72 cells/uL (ref 0–200)
EOS PCT: 2.7 %
Eosinophils Absolute: 149 cells/uL (ref 15–500)
HCT: 42.6 % (ref 35.0–45.0)
Hemoglobin: 14.3 g/dL (ref 11.7–15.5)
Lymphs Abs: 2156 cells/uL (ref 850–3900)
MCH: 30.9 pg (ref 27.0–33.0)
MCHC: 33.6 g/dL (ref 32.0–36.0)
MCV: 92 fL (ref 80.0–100.0)
MONOS PCT: 7.1 %
MPV: 9 fL (ref 7.5–12.5)
Neutro Abs: 2734 cells/uL (ref 1500–7800)
Neutrophils Relative %: 49.7 %
Platelets: 273 10*3/uL (ref 140–400)
RBC: 4.63 10*6/uL (ref 3.80–5.10)
RDW: 12.8 % (ref 11.0–15.0)
Total Lymphocyte: 39.2 %
WBC: 5.5 10*3/uL (ref 3.8–10.8)
WBCMIX: 391 {cells}/uL (ref 200–950)

## 2018-09-27 LAB — LIPID PANEL
Cholesterol: 205 mg/dL — ABNORMAL HIGH (ref <200)
HDL: 67 mg/dL (ref >50)
LDL CHOLESTEROL (CALC): 123 mg/dL — AB
NON-HDL CHOLESTEROL (CALC): 138 mg/dL — AB (ref <130)
Total CHOL/HDL Ratio: 3.1 (calc) (ref <5.0)
Triglycerides: 60 mg/dL (ref <150)

## 2018-09-28 ENCOUNTER — Encounter: Payer: Self-pay | Admitting: Family Medicine

## 2018-09-28 ENCOUNTER — Ambulatory Visit (INDEPENDENT_AMBULATORY_CARE_PROVIDER_SITE_OTHER): Payer: 59 | Admitting: Family Medicine

## 2018-09-28 VITALS — BP 110/68 | HR 76 | Temp 98.0°F | Resp 12 | Ht 64.0 in | Wt 143.0 lb

## 2018-09-28 DIAGNOSIS — Z0000 Encounter for general adult medical examination without abnormal findings: Secondary | ICD-10-CM

## 2018-09-28 NOTE — Progress Notes (Signed)
Subjective:    Patient ID: Chelsea Richmond, female    DOB: 05-Nov-1961, 57 y.o.   MRN: [id number]  HPI Patient is a very pleasant 57 year old white female who is here today for complete physical exam. She has her mammogram, Pap smear, and breast exam performed at her gynecologist. Her colonoscopy was performed in 2012.  Her immunizations are up-to-date.  she denies any medical complaints or concerns. Her most recent lab work as listed below: Lab on 09/27/2018  Component Date Value Ref Range Status  . WBC 09/27/2018 5.5  3.8 - 10.8 Thousand/uL Final  . RBC 09/27/2018 4.63  3.80 - 5.10 Million/uL Final  . Hemoglobin 09/27/2018 14.3  11.7 - 15.5 g/dL Final  . HCT 40/98/1191 42.6  35.0 - 45.0 % Final  . MCV 09/27/2018 92.0  80.0 - 100.0 fL Final  . MCH 09/27/2018 30.9  27.0 - 33.0 pg Final  . MCHC 09/27/2018 33.6  32.0 - 36.0 g/dL Final  . RDW 47/82/9562 12.8  11.0 - 15.0 % Final  . Platelets 09/27/2018 273  140 - 400 Thousand/uL Final  . MPV 09/27/2018 9.0  7.5 - 12.5 fL Final  . Neutro Abs 09/27/2018 2,734  1,500 - 7,800 cells/uL Final  . Lymphs Abs 09/27/2018 2,156  850 - 3,900 cells/uL Final  . WBC mixed population 09/27/2018 391  200 - 950 cells/uL Final  . Eosinophils Absolute 09/27/2018 149  15 - 500 cells/uL Final  . Basophils Absolute 09/27/2018 72  0 - 200 cells/uL Final  . Neutrophils Relative % 09/27/2018 49.7  % Final  . Total Lymphocyte 09/27/2018 39.2  % Final  . Monocytes Relative 09/27/2018 7.1  % Final  . Eosinophils Relative 09/27/2018 2.7  % Final  . Basophils Relative 09/27/2018 1.3  % Final  . Glucose, Bld 09/27/2018 89  65 - 99 mg/dL Final   Comment: .            Fasting reference interval .   . BUN 09/27/2018 11  7 - 25 mg/dL Final  . Creat 13/07/6577 0.64  0.50 - 1.05 mg/dL Final   Comment: For patients >17 years of age, the reference limit for Creatinine is approximately 13% higher for people identified as African-American. .   . GFR, Est Non African  American 09/27/2018 99  > OR = 60 mL/min/1.33m2 Final  . GFR, Est African American 09/27/2018 115  > OR = 60 mL/min/1.39m2 Final  . BUN/Creatinine Ratio 09/27/2018 NOT APPLICABLE  6 - 22 (calc) Final  . Sodium 09/27/2018 142  135 - 146 mmol/L Final  . Potassium 09/27/2018 4.2  3.5 - 5.3 mmol/L Final  . Chloride 09/27/2018 106  98 - 110 mmol/L Final  . CO2 09/27/2018 29  20 - 32 mmol/L Final  . Calcium 09/27/2018 9.5  8.6 - 10.4 mg/dL Final  . Total Protein 09/27/2018 6.5  6.1 - 8.1 g/dL Final  . Albumin 46/96/2952 4.0  3.6 - 5.1 g/dL Final  . Globulin 84/13/2440 2.5  1.9 - 3.7 g/dL (calc) Final  . AG Ratio 09/27/2018 1.6  1.0 - 2.5 (calc) Final  . Total Bilirubin 09/27/2018 0.6  0.2 - 1.2 mg/dL Final  . Alkaline phosphatase (APISO) 09/27/2018 76  33 - 130 U/L Final  . AST 09/27/2018 25  10 - 35 U/L Final  . ALT 09/27/2018 17  6 - 29 U/L Final  . Cholesterol 09/27/2018 205* <200 mg/dL Final  . HDL 10/11/2535 67  >50 mg/dL Final  . Triglycerides  09/27/2018 60  <150 mg/dL Final  . LDL Cholesterol (Calc) 09/27/2018 123* mg/dL (calc) Final   Comment: Reference range: <100 . Desirable range <100 mg/dL for primary prevention;   <70 mg/dL for patients with CHD or diabetic patients  with > or = 2 CHD risk factors. Marland Kitchen LDL-C is now calculated using the Martin-Hopkins  calculation, which is a validated novel method providing  better accuracy than the Friedewald equation in the  estimation of LDL-C.  Horald Pollen et al. Lenox Ahr. 1610;960(45): 2061-2068  (http://education.[facility].com/faq/FAQ164)   . Total CHOL/HDL Ratio 09/27/2018 3.1  <4.0 (calc) Final  . Non-HDL Cholesterol (Calc) 09/27/2018 138* <130 mg/dL (calc) Final   Comment: For patients with diabetes plus 1 major ASCVD risk  factor, treating to a non-HDL-C goal of <100 mg/dL  (LDL-C of <98 mg/dL) is considered a therapeutic  option.    Past Medical History:  Diagnosis Date  . Hard of hearing   . Internal hemorrhoid     Current Outpatient Medications on File Prior to Visit  Medication Sig Dispense Refill  . aspirin 81 MG chewable tablet Chew 81 mg by mouth daily.    . fish oil-omega-3 fatty acids 1000 MG capsule Take 2 g by mouth daily.    . Multiple Vitamin (MULTIVITAMIN) tablet Take 1 tablet by mouth daily.     No current facility-administered medications on file prior to visit.    Allergies  Allergen Reactions  . Penicillins    Social History   Socioeconomic History  . Marital status: Married    Spouse name: Not on file  . Number of children: Not on file  . Years of education: Not on file  . Highest education level: Not on file  Occupational History  . Not on file  Social Needs  . Financial resource strain: Not on file  . Food insecurity:    Worry: Not on file    Inability: Not on file  . Transportation needs:    Medical: Not on file    Non-medical: Not on file  Tobacco Use  . Smoking status: Never Smoker  . Smokeless tobacco: Never Used  Substance and Sexual Activity  . Alcohol use: No  . Drug use: No  . Sexual activity: Yes    Comment: Married to [place].  Daughter in 4th year of medical school.  Lifestyle  . Physical activity:    Days per week: Not on file    Minutes per session: Not on file  . Stress: Not on file  Relationships  . Social connections:    Talks on phone: Not on file    Gets together: Not on file    Attends religious service: Not on file    Active member of club or organization: Not on file    Attends meetings of clubs or organizations: Not on file    Relationship status: Not on file  . Intimate partner violence:    Fear of current or ex partner: Not on file    Emotionally abused: Not on file    Physically abused: Not on file    Forced sexual activity: Not on file  Other Topics Concern  . Not on file  Social History Narrative  . Not on file  Daughter is MD at [employer] Other daughter is trainer in orthopedics at [employer] Family History   Problem Relation Age of Onset  . Colon polyps Mother   . Hyperlipidemia Father   . Heart disease Father   . Hyperlipidemia Sister   .  Hyperlipidemia Brother   . CVA Brother       Review of Systems  All other systems reviewed and are negative.      Objective:   Physical Exam  Constitutional: She is oriented to person, place, and time. She appears well-developed and well-nourished. No distress.  HENT:  Head: Normocephalic and atraumatic.  Right Ear: External ear normal.  Left Ear: External ear normal.  Nose: Nose normal.  Mouth/Throat: Oropharynx is clear and moist. No oropharyngeal exudate.  Eyes: Pupils are equal, round, and reactive to light. Conjunctivae and EOM are normal. Right eye exhibits no discharge. Left eye exhibits no discharge. No scleral icterus.  Neck: Normal range of motion. Neck supple. No JVD present. No thyromegaly present.  Cardiovascular: Normal rate, regular rhythm and normal heart sounds. Exam reveals no gallop and no friction rub.  No murmur heard. Pulmonary/Chest: Effort normal and breath sounds normal. No stridor. No respiratory distress. She has no wheezes. She has no rales. She exhibits no tenderness.  Abdominal: Soft. Bowel sounds are normal. She exhibits no distension and no mass. There is no tenderness. There is no rebound and no guarding.  Musculoskeletal: Normal range of motion. She exhibits no edema or tenderness.  Lymphadenopathy:    She has no cervical adenopathy.  Neurological: She is alert and oriented to person, place, and time. She has normal reflexes. No cranial nerve deficit. She exhibits normal muscle tone. Coordination normal.  Skin: Skin is warm. No rash noted. She is not diaphoretic. No erythema. No pallor.  Psychiatric: She has a normal mood and affect. Her behavior is normal. Judgment and thought content normal.  Vitals reviewed.         Assessment & Plan:  Routine general medical examination at a health care  facility  Patient's physical exam is completely normal. Her cancer screening is up-to-date. Her immunizations are up-to-date except for Shingrix which we discussed.  Labs are outstanding.  Patient politely declines HIV or hepatitis C screening.  I calculated her 10-year risk for cardiovascular disease and found to be 1.6%.  Despite her elevated LDL cholesterol, no statin is recommended.  Recheck in 1 year or as needed.

## 2019-11-24 ENCOUNTER — Other Ambulatory Visit: Payer: 59

## 2019-11-28 ENCOUNTER — Encounter: Payer: 59 | Admitting: Family Medicine

## 2019-12-06 DIAGNOSIS — Z01419 Encounter for gynecological examination (general) (routine) without abnormal findings: Secondary | ICD-10-CM | POA: Diagnosis not present

## 2019-12-06 DIAGNOSIS — Z6824 Body mass index (BMI) 24.0-24.9, adult: Secondary | ICD-10-CM | POA: Diagnosis not present

## 2019-12-06 DIAGNOSIS — Z1231 Encounter for screening mammogram for malignant neoplasm of breast: Secondary | ICD-10-CM | POA: Diagnosis not present

## 2020-01-16 ENCOUNTER — Other Ambulatory Visit: Payer: Self-pay

## 2020-01-16 ENCOUNTER — Other Ambulatory Visit: Payer: BC Managed Care – PPO

## 2020-01-16 DIAGNOSIS — Z0000 Encounter for general adult medical examination without abnormal findings: Secondary | ICD-10-CM

## 2020-01-16 LAB — CBC WITH DIFFERENTIAL/PLATELET
Absolute Monocytes: 360 cells/uL (ref 200–950)
Basophils Absolute: 68 cells/uL (ref 0–200)
Basophils Relative: 1.1 %
Eosinophils Absolute: 180 cells/uL (ref 15–500)
Eosinophils Relative: 2.9 %
HCT: 41.8 % (ref 35.0–45.0)
Hemoglobin: 14 g/dL (ref 11.7–15.5)
Lymphs Abs: 2263 cells/uL (ref 850–3900)
MCH: 31.3 pg (ref 27.0–33.0)
MCHC: 33.5 g/dL (ref 32.0–36.0)
MCV: 93.5 fL (ref 80.0–100.0)
MPV: 9.5 fL (ref 7.5–12.5)
Monocytes Relative: 5.8 %
Neutro Abs: 3329 cells/uL (ref 1500–7800)
Neutrophils Relative %: 53.7 %
Platelets: 284 10*3/uL (ref 140–400)
RBC: 4.47 10*6/uL (ref 3.80–5.10)
RDW: 12.8 % (ref 11.0–15.0)
Total Lymphocyte: 36.5 %
WBC: 6.2 10*3/uL (ref 3.8–10.8)

## 2020-01-16 LAB — COMPREHENSIVE METABOLIC PANEL
AG Ratio: 1.8 (calc) (ref 1.0–2.5)
ALT: 16 U/L (ref 6–29)
AST: 18 U/L (ref 10–35)
Albumin: 4.2 g/dL (ref 3.6–5.1)
Alkaline phosphatase (APISO): 78 U/L (ref 37–153)
BUN: 13 mg/dL (ref 7–25)
CO2: 28 mmol/L (ref 20–32)
Calcium: 9.4 mg/dL (ref 8.6–10.4)
Chloride: 105 mmol/L (ref 98–110)
Creat: 0.79 mg/dL (ref 0.50–1.05)
Globulin: 2.3 g/dL (calc) (ref 1.9–3.7)
Glucose, Bld: 103 mg/dL — ABNORMAL HIGH (ref 65–99)
Potassium: 4.3 mmol/L (ref 3.5–5.3)
Sodium: 141 mmol/L (ref 135–146)
Total Bilirubin: 0.3 mg/dL (ref 0.2–1.2)
Total Protein: 6.5 g/dL (ref 6.1–8.1)

## 2020-01-16 LAB — LIPID PANEL
Cholesterol: 191 mg/dL (ref <200)
HDL: 65 mg/dL (ref >=50)
LDL Cholesterol (Calc): 112 mg/dL (calc) — ABNORMAL HIGH
Non-HDL Cholesterol (Calc): 126 mg/dL (calc) (ref <130)
Total CHOL/HDL Ratio: 2.9 (calc) (ref <5.0)
Triglycerides: 56 mg/dL (ref <150)

## 2020-01-19 ENCOUNTER — Encounter: Payer: Self-pay | Admitting: Family Medicine

## 2020-01-19 ENCOUNTER — Other Ambulatory Visit: Payer: Self-pay

## 2020-01-19 ENCOUNTER — Ambulatory Visit (INDEPENDENT_AMBULATORY_CARE_PROVIDER_SITE_OTHER): Payer: BC Managed Care – PPO | Admitting: Family Medicine

## 2020-01-19 VITALS — BP 122/74 | HR 84 | Temp 96.9°F | Resp 14 | Ht 64.0 in | Wt 142.0 lb

## 2020-01-19 DIAGNOSIS — Z0000 Encounter for general adult medical examination without abnormal findings: Secondary | ICD-10-CM | POA: Diagnosis not present

## 2020-01-19 NOTE — Progress Notes (Signed)
Subjective:    Patient ID: Chelsea Richmond, female    DOB: 02/19/1961, 59 y.o.   MRN: [id number]  HPI Patient is a very pleasant 59 year old white female who is here today for complete physical exam. She has her mammogram, Pap smear, and breast exam performed at her gynecologist. Her colonoscopy was performed in 2012.  Her immunizations are up-to-date.  she denies any medical complaints or concerns. Her most recent lab work as listed below: Lab on 01/16/2020  Component Date Value Ref Range Status  . Cholesterol 01/16/2020 191  <200 mg/dL Final  . HDL 41/96/2229 65  > OR = 50 mg/dL Final  . Triglycerides 01/16/2020 56  <150 mg/dL Final  . LDL Cholesterol (Calc) 01/16/2020 112* mg/dL (calc) Final   Comment: Reference range: <100 . Desirable range <100 mg/dL for primary prevention;   <70 mg/dL for patients with CHD or diabetic patients  with > or = 2 CHD risk factors. Marland Kitchen LDL-C is now calculated using the Martin-Hopkins  calculation, which is a validated novel method providing  better accuracy than the Friedewald equation in the  estimation of LDL-C.  Horald Pollen et al. Lenox Ahr. 7989;211(94): 2061-2068  (http://education.[facility].com/faq/FAQ164)   . Total CHOL/HDL Ratio 01/16/2020 2.9  <1.7 (calc) Final  . Non-HDL Cholesterol (Calc) 01/16/2020 126  <130 mg/dL (calc) Final   Comment: For patients with diabetes plus 1 major ASCVD risk  factor, treating to a non-HDL-C goal of <100 mg/dL  (LDL-C of <40 mg/dL) is considered a therapeutic  option.   . Glucose, Bld 01/16/2020 103* 65 - 99 mg/dL Final   Comment: .            Fasting reference interval . For someone without known diabetes, a glucose value between 100 and 125 mg/dL is consistent with prediabetes and should be confirmed with a follow-up test. .   . BUN 01/16/2020 13  7 - 25 mg/dL Final  . Creat 81/44/8185 0.79  0.50 - 1.05 mg/dL Final   Comment: For patients >58 years of age, the reference limit for Creatinine is  approximately 13% higher for people identified as African-American. .   Edwena Felty Ratio 01/16/2020 NOT APPLICABLE  6 - 22 (calc) Final  . Sodium 01/16/2020 141  135 - 146 mmol/L Final  . Potassium 01/16/2020 4.3  3.5 - 5.3 mmol/L Final  . Chloride 01/16/2020 105  98 - 110 mmol/L Final  . CO2 01/16/2020 28  20 - 32 mmol/L Final  . Calcium 01/16/2020 9.4  8.6 - 10.4 mg/dL Final  . Total Protein 01/16/2020 6.5  6.1 - 8.1 g/dL Final  . Albumin 63/14/9702 4.2  3.6 - 5.1 g/dL Final  . Globulin 63/78/5885 2.3  1.9 - 3.7 g/dL (calc) Final  . AG Ratio 01/16/2020 1.8  1.0 - 2.5 (calc) Final  . Total Bilirubin 01/16/2020 0.3  0.2 - 1.2 mg/dL Final  . Alkaline phosphatase (APISO) 01/16/2020 78  37 - 153 U/L Final  . AST 01/16/2020 18  10 - 35 U/L Final  . ALT 01/16/2020 16  6 - 29 U/L Final  . WBC 01/16/2020 6.2  3.8 - 10.8 Thousand/uL Final  . RBC 01/16/2020 4.47  3.80 - 5.10 Million/uL Final  . Hemoglobin 01/16/2020 14.0  11.7 - 15.5 g/dL Final  . HCT 02/77/4128 41.8  35.0 - 45.0 % Final  . MCV 01/16/2020 93.5  80.0 - 100.0 fL Final  . MCH 01/16/2020 31.3  27.0 - 33.0 pg Final  . MCHC 01/16/2020 33.5  32.0 - 36.0 g/dL Final  . RDW 55/73/2202 12.8  11.0 - 15.0 % Final  . Platelets 01/16/2020 284  140 - 400 Thousand/uL Final  . MPV 01/16/2020 9.5  7.5 - 12.5 fL Final  . Neutro Abs 01/16/2020 3,329  1,500 - 7,800 cells/uL Final  . Lymphs Abs 01/16/2020 2,263  850 - 3,900 cells/uL Final  . Absolute Monocytes 01/16/2020 360  200 - 950 cells/uL Final  . Eosinophils Absolute 01/16/2020 180  15 - 500 cells/uL Final  . Basophils Absolute 01/16/2020 68  0 - 200 cells/uL Final  . Neutrophils Relative % 01/16/2020 53.7  % Final  . Total Lymphocyte 01/16/2020 36.5  % Final  . Monocytes Relative 01/16/2020 5.8  % Final  . Eosinophils Relative 01/16/2020 2.9  % Final  . Basophils Relative 01/16/2020 1.1  % Final   Past Medical History:  Diagnosis Date  . Hard of hearing   . Internal  hemorrhoid    Current Outpatient Medications on File Prior to Visit  Medication Sig Dispense Refill  . aspirin 81 MG chewable tablet Chew 81 mg by mouth daily.    . fish oil-omega-3 fatty acids 1000 MG capsule Take 2 g by mouth daily.    . Multiple Vitamin (MULTIVITAMIN) tablet Take 1 tablet by mouth daily.    . Turmeric 1053 MG TABS turmeric     No current facility-administered medications on file prior to visit.   Allergies  Allergen Reactions  . Penicillins    Social History   Socioeconomic History  . Marital status: Married    Spouse name: Not on file  . Number of children: Not on file  . Years of education: Not on file  . Highest education level: Not on file  Occupational History  . Not on file  Tobacco Use  . Smoking status: Never Smoker  . Smokeless tobacco: Never Used  Substance and Sexual Activity  . Alcohol use: No  . Drug use: No  . Sexual activity: Yes    Comment: Married to [place].  Daughter in 4th year of medical school.  Other Topics Concern  . Not on file  Social History Narrative  . Not on file   Social Determinants of Health   Financial Resource Strain:   . Difficulty of Paying Living Expenses: Not on file  Food Insecurity:   . Worried About Programme researcher, broadcasting/film/video in the Last Year: Not on file  . Ran Out of Food in the Last Year: Not on file  Transportation Needs:   . Lack of Transportation (Medical): Not on file  . Lack of Transportation (Non-Medical): Not on file  Physical Activity:   . Days of Exercise per Week: Not on file  . Minutes of Exercise per Session: Not on file  Stress:   . Feeling of Stress : Not on file  Social Connections:   . Frequency of Communication with Friends and Family: Not on file  . Frequency of Social Gatherings with Friends and Family: Not on file  . Attends Religious Services: Not on file  . Active Member of Clubs or Organizations: Not on file  . Attends Banker Meetings: Not on file  . Marital  Status: Not on file  Intimate Partner Violence:   . Fear of Current or Ex-Partner: Not on file  . Emotionally Abused: Not on file  . Physically Abused: Not on file  . Sexually Abused: Not on file  Daughter is MD at [employer] Other daughter is  trainer in orthopedics at [employer] Family History  Problem Relation Age of Onset  . Colon polyps Mother   . Hyperlipidemia Father   . Heart disease Father   . Hyperlipidemia Sister   . Hyperlipidemia Brother   . CVA Brother       Review of Systems  All other systems reviewed and are negative.      Objective:   Physical Exam  Constitutional: She is oriented to person, place, and time. She appears well-developed and well-nourished. No distress.  HENT:  Head: Normocephalic and atraumatic.  Right Ear: External ear normal.  Left Ear: External ear normal.  Nose: Nose normal.  Mouth/Throat: Oropharynx is clear and moist. No oropharyngeal exudate.  Eyes: Pupils are equal, round, and reactive to light. Conjunctivae and EOM are normal. Right eye exhibits no discharge. Left eye exhibits no discharge. No scleral icterus.  Neck: No JVD present. No thyromegaly present.  Cardiovascular: Normal rate, regular rhythm and normal heart sounds. Exam reveals no gallop and no friction rub.  No murmur heard. Pulmonary/Chest: Effort normal and breath sounds normal. No stridor. No respiratory distress. She has no wheezes. She has no rales. She exhibits no tenderness.  Abdominal: Soft. Bowel sounds are normal. She exhibits no distension and no mass. There is no abdominal tenderness. There is no rebound and no guarding.  Musculoskeletal:        General: No tenderness or edema. Normal range of motion.     Cervical back: Normal range of motion and neck supple.  Lymphadenopathy:    She has no cervical adenopathy.  Neurological: She is alert and oriented to person, place, and time. She has normal reflexes. No cranial nerve deficit. She exhibits normal muscle  tone. Coordination normal.  Skin: Skin is warm. No rash noted. She is not diaphoretic. No erythema. No pallor.  Psychiatric: She has a normal mood and affect. Her behavior is normal. Judgment and thought content normal.  Vitals reviewed.         Assessment & Plan:  Routine general medical examination at a health care facility  Physical exam today is excellent.  Lab work is outstanding except for a mild elevation in her blood sugar at 103.  We discussed low carbohydrate diets and I have recommended that she try to limit her carbohydrates slightly.  Of also recommended that she increase her aerobic exercise to 30 minutes a day 5 days a week when possible.  I have recommended 1200 mg a day of calcium and 1000 units a day of vitamin D.  Her mammogram is up-to-date and was performed in December at her gynecologist.  Colonoscopy is not due until 2022.  She has a Pap smear every 3 years at her gynecologist and this is up-to-date.  She is not due for a bone density test until age 22.  Immunizations are up-to-date except for the shingles vaccine which we discussed today.  Regular anticipatory guidance is provided.

## 2020-02-25 DIAGNOSIS — Z23 Encounter for immunization: Secondary | ICD-10-CM | POA: Diagnosis not present

## 2020-03-23 DIAGNOSIS — Z23 Encounter for immunization: Secondary | ICD-10-CM | POA: Diagnosis not present

## 2020-06-15 DIAGNOSIS — H16141 Punctate keratitis, right eye: Secondary | ICD-10-CM | POA: Diagnosis not present

## 2020-06-26 DIAGNOSIS — H16141 Punctate keratitis, right eye: Secondary | ICD-10-CM | POA: Diagnosis not present

## 2021-04-29 ENCOUNTER — Other Ambulatory Visit: Payer: Self-pay

## 2021-04-29 ENCOUNTER — Other Ambulatory Visit: Payer: BC Managed Care – PPO

## 2021-04-29 DIAGNOSIS — Z136 Encounter for screening for cardiovascular disorders: Secondary | ICD-10-CM | POA: Diagnosis not present

## 2021-04-29 DIAGNOSIS — Z13228 Encounter for screening for other metabolic disorders: Secondary | ICD-10-CM | POA: Diagnosis not present

## 2021-04-29 DIAGNOSIS — Z0000 Encounter for general adult medical examination without abnormal findings: Secondary | ICD-10-CM

## 2021-04-29 DIAGNOSIS — Z130 Encounter for screening for diseases of the blood and blood-forming organs and certain disorders involving the immune mechanism: Secondary | ICD-10-CM

## 2021-04-29 LAB — CBC WITH DIFFERENTIAL/PLATELET
HCT: 43.2 % (ref 35.0–45.0)
MCHC: 32.6 g/dL (ref 32.0–36.0)
Neutrophils Relative %: 48.8 %
Platelets: 267 10*3/uL (ref 140–400)
RBC: 4.6 10*6/uL (ref 3.80–5.10)

## 2021-04-30 LAB — CBC WITH DIFFERENTIAL/PLATELET
Absolute Monocytes: 400 cells/uL (ref 200–950)
Basophils Absolute: 69 cells/uL (ref 0–200)
Basophils Relative: 1 %
Eosinophils Absolute: 228 cells/uL (ref 15–500)
Eosinophils Relative: 3.3 %
Hemoglobin: 14.1 g/dL (ref 11.7–15.5)
Lymphs Abs: 2836 cells/uL (ref 850–3900)
MCH: 30.7 pg (ref 27.0–33.0)
MCV: 93.9 fL (ref 80.0–100.0)
MPV: 8.9 fL (ref 7.5–12.5)
Monocytes Relative: 5.8 %
Neutro Abs: 3367 cells/uL (ref 1500–7800)
RDW: 12.7 % (ref 11.0–15.0)
Total Lymphocyte: 41.1 %
WBC: 6.9 10*3/uL (ref 3.8–10.8)

## 2021-04-30 LAB — LIPID PANEL
Cholesterol: 189 mg/dL (ref <200)
HDL: 65 mg/dL (ref >=50)
LDL Cholesterol (Calc): 104 mg/dL (calc) — ABNORMAL HIGH
Non-HDL Cholesterol (Calc): 124 mg/dL (calc) (ref <130)
Total CHOL/HDL Ratio: 2.9 (calc) (ref <5.0)
Triglycerides: 104 mg/dL (ref <150)

## 2021-04-30 LAB — COMPLETE METABOLIC PANEL WITH GFR
AG Ratio: 1.8 (calc) (ref 1.0–2.5)
ALT: 15 U/L (ref 6–29)
AST: 15 U/L (ref 10–35)
Albumin: 4.3 g/dL (ref 3.6–5.1)
Alkaline phosphatase (APISO): 71 U/L (ref 37–153)
BUN: 15 mg/dL (ref 7–25)
CO2: 31 mmol/L (ref 20–32)
Calcium: 9.6 mg/dL (ref 8.6–10.4)
Chloride: 100 mmol/L (ref 98–110)
Creat: 0.72 mg/dL (ref 0.50–0.99)
GFR, Est African American: 105 mL/min/{1.73_m2} (ref >=60)
GFR, Est Non African American: 91 mL/min/{1.73_m2} (ref >=60)
Globulin: 2.4 g/dL (calc) (ref 1.9–3.7)
Glucose, Bld: 97 mg/dL (ref 65–99)
Potassium: 4 mmol/L (ref 3.5–5.3)
Sodium: 139 mmol/L (ref 135–146)
Total Bilirubin: 0.4 mg/dL (ref 0.2–1.2)
Total Protein: 6.7 g/dL (ref 6.1–8.1)

## 2021-05-07 ENCOUNTER — Other Ambulatory Visit: Payer: Self-pay

## 2021-05-07 ENCOUNTER — Encounter: Payer: Self-pay | Admitting: Family Medicine

## 2021-05-07 ENCOUNTER — Ambulatory Visit (INDEPENDENT_AMBULATORY_CARE_PROVIDER_SITE_OTHER): Payer: BC Managed Care – PPO | Admitting: Family Medicine

## 2021-05-07 VITALS — BP 120/66 | HR 72 | Temp 97.9°F | Resp 16 | Ht 64.0 in | Wt 154.0 lb

## 2021-05-07 DIAGNOSIS — Z0000 Encounter for general adult medical examination without abnormal findings: Secondary | ICD-10-CM | POA: Diagnosis not present

## 2021-05-07 DIAGNOSIS — F411 Generalized anxiety disorder: Secondary | ICD-10-CM

## 2021-05-07 DIAGNOSIS — Z1211 Encounter for screening for malignant neoplasm of colon: Secondary | ICD-10-CM

## 2021-05-07 MED ORDER — SERTRALINE HCL 50 MG PO TABS: 50.0000 mg | ORAL_TABLET | Freq: Every day | ORAL | 3 refills | Status: DC

## 2021-05-07 NOTE — Progress Notes (Signed)
Subjective:    Patient ID: Chelsea Richmond, female    DOB: 03-06-1961, 60 y.o.   MRN: [id number]  HPI Patient is a very pleasant 60 year old white female who is here today for complete physical exam. She has her mammogram, Pap smear, and breast exam performed at her gynecologist. Her colonoscopy was performed in 2012.  She is due this year for a colonoscopy and request that I schedule this.  She has not made an appointment yet with her gynecologist to schedule her mammogram.  Her last Pap smear was last year.  Therefore the only cancer test that she is due for would be her mammogram.  She is due for the shingles vaccine.  She has had 3 doses of the COVID-vaccine.  The remainder of her vaccinations are up-to-date.  Her only concern is starting Zoloft.  She states that she is under tremendous stress.  She deals with daily anxiety.  She feels anxious even when she knows she should not.  This is causing her hard time to relax, trouble sleeping, and trouble with her quality of life.  She has had a long discussion with her family and both of her daughters have suggested that she take Zoloft.  I explained the mechanism of action of Zoloft.  She denies any depression but she does feel that the anxiety is present almost on a daily basis and she is interested in trying a medication to see if it will help better manage and prevent and control her anxiety.  Her most recent lab work as listed below: Lab on 04/29/2021  Component Date Value Ref Range Status  . Glucose, Bld 04/29/2021 97  65 - 99 mg/dL Final   Comment: .            Fasting reference interval .   . BUN 04/29/2021 15  7 - 25 mg/dL Final  . Creat 72/53/6644 0.72  0.50 - 0.99 mg/dL Final   Comment: For patients >92 years of age, the reference limit for Creatinine is approximately 13% higher for people identified as African-American. .   . GFR, Est Non African American 04/29/2021 91  > OR = 60 mL/min/1.37m2 Final  . GFR, Est African American  04/29/2021 105  > OR = 60 mL/min/1.62m2 Final  . BUN/Creatinine Ratio 04/29/2021 NOT APPLICABLE  6 - 22 (calc) Final  . Sodium 04/29/2021 139  135 - 146 mmol/L Final  . Potassium 04/29/2021 4.0  3.5 - 5.3 mmol/L Final  . Chloride 04/29/2021 100  98 - 110 mmol/L Final  . CO2 04/29/2021 31  20 - 32 mmol/L Final  . Calcium 04/29/2021 9.6  8.6 - 10.4 mg/dL Final  . Total Protein 04/29/2021 6.7  6.1 - 8.1 g/dL Final  . Albumin 03/47/4259 4.3  3.6 - 5.1 g/dL Final  . Globulin 56/38/7564 2.4  1.9 - 3.7 g/dL (calc) Final  . AG Ratio 04/29/2021 1.8  1.0 - 2.5 (calc) Final  . Total Bilirubin 04/29/2021 0.4  0.2 - 1.2 mg/dL Final  . Alkaline phosphatase (APISO) 04/29/2021 71  37 - 153 U/L Final  . AST 04/29/2021 15  10 - 35 U/L Final  . ALT 04/29/2021 15  6 - 29 U/L Final  . Cholesterol 04/29/2021 189  <200 mg/dL Final  . HDL 33/29/5188 65  > OR = 50 mg/dL Final  . Triglycerides 04/29/2021 104  <150 mg/dL Final  . LDL Cholesterol (Calc) 04/29/2021 104* mg/dL (calc) Final   Comment: Reference range: <100 . Desirable range <  100 mg/dL for primary prevention;   <70 mg/dL for patients with CHD or diabetic patients  with > or = 2 CHD risk factors. Marland Kitchen. LDL-C is now calculated using the Martin-Hopkins  calculation, which is a validated novel method providing  better accuracy than the Friedewald equation in the  estimation of LDL-C.  Horald PollenMartin SS et al. Lenox AhrJAMA. 1610;960(452013;310(19): 2061-2068  (http://education.[facility].com/faq/FAQ164)   . Total CHOL/HDL Ratio 04/29/2021 2.9  <4.0<5.0 (calc) Final  . Non-HDL Cholesterol (Calc) 04/29/2021 124  <130 mg/dL (calc) Final   Comment: For patients with diabetes plus 1 major ASCVD risk  factor, treating to a non-HDL-C goal of <100 mg/dL  (LDL-C of <98<70 mg/dL) is considered a therapeutic  option.   . WBC 04/29/2021 6.9  3.8 - 10.8 Thousand/uL Final  . RBC 04/29/2021 4.60  3.80 - 5.10 Million/uL Final  . Hemoglobin 04/29/2021 14.1  11.7 - 15.5 g/dL Final  . HCT  11/91/478205/16/2022 43.2  35.0 - 45.0 % Final  . MCV 04/29/2021 93.9  80.0 - 100.0 fL Final  . MCH 04/29/2021 30.7  27.0 - 33.0 pg Final  . MCHC 04/29/2021 32.6  32.0 - 36.0 g/dL Final  . RDW 95/62/130805/16/2022 12.7  11.0 - 15.0 % Final  . Platelets 04/29/2021 267  140 - 400 Thousand/uL Final  . MPV 04/29/2021 8.9  7.5 - 12.5 fL Final  . Neutro Abs 04/29/2021 3,367  1,500 - 7,800 cells/uL Final  . Lymphs Abs 04/29/2021 2,836  850 - 3,900 cells/uL Final  . Absolute Monocytes 04/29/2021 400  200 - 950 cells/uL Final  . Eosinophils Absolute 04/29/2021 228  15 - 500 cells/uL Final  . Basophils Absolute 04/29/2021 69  0 - 200 cells/uL Final  . Neutrophils Relative % 04/29/2021 48.8  % Final  . Total Lymphocyte 04/29/2021 41.1  % Final  . Monocytes Relative 04/29/2021 5.8  % Final  . Eosinophils Relative 04/29/2021 3.3  % Final  . Basophils Relative 04/29/2021 1.0  % Final   Past Medical History:  Diagnosis Date  . Hard of hearing   . Internal hemorrhoid    Current Outpatient Medications on File Prior to Visit  Medication Sig Dispense Refill  . aspirin 81 MG chewable tablet Chew 81 mg by mouth daily.    . cholecalciferol (VITAMIN D3) 25 MCG (1000 UNIT) tablet Take 1,000 Units by mouth daily.    . Cyanocobalamin (VITAMIN B 12 PO) Take by mouth.    . fish oil-omega-3 fatty acids 1000 MG capsule Take 2 g by mouth daily.    . Misc Natural Products (NEURIVA PO) Take by mouth.    . Multiple Vitamin (MULTIVITAMIN) tablet Take 1 tablet by mouth daily.    . Turmeric 1053 MG TABS turmeric     No current facility-administered medications on file prior to visit.   Allergies  Allergen Reactions  . Penicillins    Social History   Socioeconomic History  . Marital status: Married    Spouse name: Not on file  . Number of children: Not on file  . Years of education: Not on file  . Highest education level: Not on file  Occupational History  . Not on file  Tobacco Use  . Smoking status: Never Smoker  .  Smokeless tobacco: Never Used  Substance and Sexual Activity  . Alcohol use: No  . Drug use: No  . Sexual activity: Yes    Comment: Married [place].  Daughter in 4th year of medical school.  Other Topics Concern  .  Not on file  Social History Narrative  . Not on file   Social Determinants of Health   Financial Resource Strain: Not on file  Food Insecurity: Not on file  Transportation Needs: Not on file  Physical Activity: Not on file  Stress: Not on file  Social Connections: Not on file  Intimate Partner Violence: Not on file  Daughter is MD at [employer] Other daughter is Psychologist, educational in orthopedics at [employer] Family History  Problem Relation Age of Onset  . Colon polyps Mother   . Hyperlipidemia Father   . Heart disease Father   . Hyperlipidemia Sister   . Hyperlipidemia Brother   . CVA Brother       Review of Systems  All other systems reviewed and are negative.      Objective:   Physical Exam Vitals reviewed.  Constitutional:      General: She is not in acute distress.    Appearance: She is well-developed. She is not diaphoretic.  HENT:     Head: Normocephalic and atraumatic.     Right Ear: External ear normal.     Left Ear: External ear normal.     Nose: Nose normal.     Mouth/Throat:     Pharynx: No oropharyngeal exudate.  Eyes:     General: No scleral icterus.       Right eye: No discharge.        Left eye: No discharge.     Conjunctiva/sclera: Conjunctivae normal.     Pupils: Pupils are equal, round, and reactive to light.  Neck:     Thyroid: No thyromegaly.     Vascular: No JVD.  Cardiovascular:     Rate and Rhythm: Normal rate and regular rhythm.     Heart sounds: Normal heart sounds. No murmur heard. No friction rub. No gallop.   Pulmonary:     Effort: Pulmonary effort is normal. No respiratory distress.     Breath sounds: Normal breath sounds. No stridor. No wheezing or rales.  Chest:     Chest wall: No tenderness.  Abdominal:      General: Bowel sounds are normal. There is no distension.     Palpations: Abdomen is soft. There is no mass.     Tenderness: There is no abdominal tenderness. There is no guarding or rebound.  Musculoskeletal:        General: No tenderness. Normal range of motion.     Cervical back: Normal range of motion and neck supple.  Lymphadenopathy:     Cervical: No cervical adenopathy.  Skin:    General: Skin is warm.     Coloration: Skin is not pale.     Findings: No erythema or rash.  Neurological:     Mental Status: She is alert and oriented to person, place, and time.     Cranial Nerves: No cranial nerve deficit.     Motor: No abnormal muscle tone.     Coordination: Coordination normal.     Deep Tendon Reflexes: Reflexes are normal and symmetric.  Psychiatric:        Behavior: Behavior normal.        Thought Content: Thought content normal.        Judgment: Judgment normal.           Assessment & Plan:  Colon cancer screening - Plan: Ambulatory referral to Gastroenterology  Routine general medical examination at a health care facility  GAD (generalized anxiety disorder)  Blood work is outstanding.  Physical exam today is normal.  Colonoscopy will be scheduled.  Pap smear is up-to-date.  Encouraged her to schedule her mammogram.  Recommended calcium and vitamin D for osteoporosis prevention.  We will try Zoloft 50 mg p.o. nightly and then reassess in 4 to 6 weeks to see if this is beneficial.  Recommended the shingles vaccine.  The remainder of her preventative care is up-to-date.

## 2021-05-24 ENCOUNTER — Encounter: Payer: Self-pay | Admitting: *Deleted

## 2021-05-29 ENCOUNTER — Other Ambulatory Visit: Payer: Self-pay | Admitting: Family Medicine

## 2021-05-31 ENCOUNTER — Other Ambulatory Visit: Payer: Self-pay | Admitting: *Deleted

## 2021-05-31 ENCOUNTER — Telehealth: Payer: Self-pay | Admitting: *Deleted

## 2021-05-31 NOTE — Telephone Encounter (Signed)
Received call from patient.   Reports that she discussed with PCP increasing Zoloft. States that she feels some improvement from Zoloft.   Plan from 05/07/2021: We will try Zoloft 50 mg p.o. nightly and then reassess in 4 to 6 weeks to see if this is beneficial.  Please advise.

## 2021-06-03 MED ORDER — SERTRALINE HCL 100 MG PO TABS: 100.0000 mg | ORAL_TABLET | Freq: Every day | ORAL | 1 refills | Status: DC

## 2021-06-03 NOTE — Telephone Encounter (Signed)
Prescription sent to pharmacy.   Call placed to patient. LMTRC.  

## 2021-06-04 ENCOUNTER — Telehealth: Payer: Self-pay | Admitting: Family Medicine

## 2021-06-04 NOTE — Telephone Encounter (Signed)
Patient called to request a change of providers for the colonoscopy referral she received. She's requesting the referral be made for Dr. Loreta Ave in [place]; patient has already established care at that office.   Please advise at [phone].

## 2021-06-04 NOTE — Telephone Encounter (Signed)
Call placed to patient. LMTRC.  

## 2021-06-04 NOTE — Telephone Encounter (Signed)
Please see additional notes 

## 2021-06-05 NOTE — Telephone Encounter (Signed)
Multiple calls placed to patient with no answer and no return call.   Message to be closed.  

## 2021-06-24 ENCOUNTER — Ambulatory Visit: Payer: Self-pay

## 2021-07-29 ENCOUNTER — Encounter: Payer: Self-pay | Admitting: Family Medicine

## 2021-09-23 DIAGNOSIS — H18823 Corneal disorder due to contact lens, bilateral: Secondary | ICD-10-CM | POA: Diagnosis not present

## 2021-11-25 ENCOUNTER — Other Ambulatory Visit: Payer: Self-pay | Admitting: Family Medicine

## 2022-02-17 ENCOUNTER — Other Ambulatory Visit: Payer: Self-pay | Admitting: Family Medicine

## 2022-03-13 ENCOUNTER — Other Ambulatory Visit: Payer: Self-pay | Admitting: Family Medicine

## 2022-05-06 ENCOUNTER — Other Ambulatory Visit: Payer: BC Managed Care – PPO

## 2022-05-06 DIAGNOSIS — K648 Other hemorrhoids: Secondary | ICD-10-CM | POA: Diagnosis not present

## 2022-05-07 LAB — COMPREHENSIVE METABOLIC PANEL
AG Ratio: 1.8 (calc) (ref 1.0–2.5)
ALT: 19 U/L (ref 6–29)
AST: 18 U/L (ref 10–35)
Albumin: 4.3 g/dL (ref 3.6–5.1)
Alkaline phosphatase (APISO): 67 U/L (ref 37–153)
BUN: 14 mg/dL (ref 7–25)
CO2: 33 mmol/L — ABNORMAL HIGH (ref 20–32)
Calcium: 9.4 mg/dL (ref 8.6–10.4)
Chloride: 103 mmol/L (ref 98–110)
Creat: 0.68 mg/dL (ref 0.50–1.05)
Globulin: 2.4 g/dL (calc) (ref 1.9–3.7)
Glucose, Bld: 97 mg/dL (ref 65–99)
Potassium: 4.3 mmol/L (ref 3.5–5.3)
Sodium: 142 mmol/L (ref 135–146)
Total Bilirubin: 0.4 mg/dL (ref 0.2–1.2)
Total Protein: 6.7 g/dL (ref 6.1–8.1)

## 2022-05-07 LAB — LIPID PANEL
Cholesterol: 212 mg/dL — ABNORMAL HIGH (ref <200)
HDL: 64 mg/dL (ref >=50)
LDL Cholesterol (Calc): 124 mg/dL (calc) — ABNORMAL HIGH
Non-HDL Cholesterol (Calc): 148 mg/dL (calc) — ABNORMAL HIGH (ref <130)
Total CHOL/HDL Ratio: 3.3 (calc) (ref <5.0)
Triglycerides: 125 mg/dL (ref <150)

## 2022-05-07 LAB — CBC WITH DIFFERENTIAL/PLATELET
Absolute Monocytes: 372 cells/uL (ref 200–950)
Basophils Absolute: 49 cells/uL (ref 0–200)
Basophils Relative: 0.8 %
Eosinophils Absolute: 189 cells/uL (ref 15–500)
Eosinophils Relative: 3.1 %
HCT: 43.4 % (ref 35.0–45.0)
Hemoglobin: 14.4 g/dL (ref 11.7–15.5)
Lymphs Abs: 2068 cells/uL (ref 850–3900)
MCH: 31 pg (ref 27.0–33.0)
MCHC: 33.2 g/dL (ref 32.0–36.0)
MCV: 93.3 fL (ref 80.0–100.0)
MPV: 9.2 fL (ref 7.5–12.5)
Monocytes Relative: 6.1 %
Neutro Abs: 3422 cells/uL (ref 1500–7800)
Neutrophils Relative %: 56.1 %
Platelets: 241 10*3/uL (ref 140–400)
RBC: 4.65 10*6/uL (ref 3.80–5.10)
RDW: 13.1 % (ref 11.0–15.0)
Total Lymphocyte: 33.9 %
WBC: 6.1 10*3/uL (ref 3.8–10.8)

## 2022-05-15 ENCOUNTER — Ambulatory Visit (INDEPENDENT_AMBULATORY_CARE_PROVIDER_SITE_OTHER): Payer: BC Managed Care – PPO | Admitting: Family Medicine

## 2022-05-15 VITALS — BP 98/68 | HR 83 | Temp 97.8°F | Ht 64.0 in | Wt 149.8 lb

## 2022-05-15 DIAGNOSIS — Z1211 Encounter for screening for malignant neoplasm of colon: Secondary | ICD-10-CM | POA: Diagnosis not present

## 2022-05-15 DIAGNOSIS — Z0000 Encounter for general adult medical examination without abnormal findings: Secondary | ICD-10-CM

## 2022-05-15 NOTE — Progress Notes (Signed)
Subjective:    Patient ID: Chelsea Richmond, female    DOB: [birth year], 61 y.o.   MRN: [id number]  HPI Patient is a very pleasant 61 year old white female who is here today for complete physical exam.  He denies any medical concerns.  She would like me to schedule her for a colonoscopy as this is overdue.  Last year the scheduling fell through.  She sees her gynecologist later in June for her mammogram and her Pap smear.  She is due for the shingles vaccine, flu shot in the fall, and the COVID booster.  Her most recent lab work is listed below Lab on 05/06/2022  Component Date Value Ref Range Status   Glucose, Bld 05/06/2022 97  65 - 99 mg/dL Final   Comment: .            Fasting reference interval .    BUN 05/06/2022 14  7 - 25 mg/dL Final   Creat 75/09/2584 0.68  0.50 - 1.05 mg/dL Final   BUN/Creatinine Ratio 05/06/2022 NOT APPLICABLE  6 - 22 (calc) Final   Sodium 05/06/2022 142  135 - 146 mmol/L Final   Potassium 05/06/2022 4.3  3.5 - 5.3 mmol/L Final   Chloride 05/06/2022 103  98 - 110 mmol/L Final   CO2 05/06/2022 33 (H)  20 - 32 mmol/L Final   Calcium 05/06/2022 9.4  8.6 - 10.4 mg/dL Final   Total Protein 27/78/2423 6.7  6.1 - 8.1 g/dL Final   Albumin 53/61/4431 4.3  3.6 - 5.1 g/dL Final   Globulin 54/00/8676 2.4  1.9 - 3.7 g/dL (calc) Final   AG Ratio 05/06/2022 1.8  1.0 - 2.5 (calc) Final   Total Bilirubin 05/06/2022 0.4  0.2 - 1.2 mg/dL Final   Alkaline phosphatase (APISO) 05/06/2022 67  37 - 153 U/L Final   AST 05/06/2022 18  10 - 35 U/L Final   ALT 05/06/2022 19  6 - 29 U/L Final   WBC 05/06/2022 6.1  3.8 - 10.8 Thousand/uL Final   RBC 05/06/2022 4.65  3.80 - 5.10 Million/uL Final   Hemoglobin 05/06/2022 14.4  11.7 - 15.5 g/dL Final   HCT 19/50/9326 43.4  35.0 - 45.0 % Final   MCV 05/06/2022 93.3  80.0 - 100.0 fL Final   MCH 05/06/2022 31.0  27.0 - 33.0 pg Final   MCHC 05/06/2022 33.2  32.0 - 36.0 g/dL Final   RDW 71/24/5809 13.1  11.0 - 15.0 % Final   Platelets  05/06/2022 241  140 - 400 Thousand/uL Final   MPV 05/06/2022 9.2  7.5 - 12.5 fL Final   Neutro Abs 05/06/2022 3,422  1,500 - 7,800 cells/uL Final   Lymphs Abs 05/06/2022 2,068  850 - 3,900 cells/uL Final   Absolute Monocytes 05/06/2022 372  200 - 950 cells/uL Final   Eosinophils Absolute 05/06/2022 189  15 - 500 cells/uL Final   Basophils Absolute 05/06/2022 49  0 - 200 cells/uL Final   Neutrophils Relative % 05/06/2022 56.1  % Final   Total Lymphocyte 05/06/2022 33.9  % Final   Monocytes Relative 05/06/2022 6.1  % Final   Eosinophils Relative 05/06/2022 3.1  % Final   Basophils Relative 05/06/2022 0.8  % Final   Cholesterol 05/06/2022 212 (H)  <200 mg/dL Final   HDL 98/33/8250 64  > OR = 50 mg/dL Final   Triglycerides 53/97/6734 125  <150 mg/dL Final   LDL Cholesterol (Calc) 05/06/2022 124 (H)  mg/dL (calc) Final   Comment: Reference  range: <100 . Desirable range <100 mg/dL for primary prevention;   <70 mg/dL for patients with CHD or diabetic patients  with > or = 2 CHD risk factors. Marland Kitchen. LDL-C is now calculated using the Martin-Hopkins  calculation, which is a validated novel method providing  better accuracy than the Friedewald equation in the  estimation of LDL-C.  Horald PollenMartin SS et al. Lenox AhrJAMA. 1610;960(452013;310(19): 2061-2068  (http://education.[facility].com/faq/FAQ164)    Total CHOL/HDL Ratio 05/06/2022 3.3  <4.0<5.0 (calc) Final   Non-HDL Cholesterol (Calc) 05/06/2022 148 (H)  <130 mg/dL (calc) Final   Comment: For patients with diabetes plus 1 major ASCVD risk  factor, treating to a non-HDL-C goal of <100 mg/dL  (LDL-C of <98<70 mg/dL) is considered a therapeutic  option.    Past Medical History:  Diagnosis Date   Hard of hearing    Internal hemorrhoid    Current Outpatient Medications on File Prior to Visit  Medication Sig Dispense Refill   aspirin 81 MG chewable tablet Chew 81 mg by mouth daily.     cholecalciferol (VITAMIN D3) 25 MCG (1000 UNIT) tablet Take 1,000 Units by mouth  daily.     Cyanocobalamin (VITAMIN B 12 PO) Take by mouth.     fish oil-omega-3 fatty acids 1000 MG capsule Take 2 g by mouth daily.     Misc Natural Products (NEURIVA PO) Take by mouth.     Multiple Vitamin (MULTIVITAMIN) tablet Take 1 tablet by mouth daily.     sertraline (ZOLOFT) 100 MG tablet TAKE 1 TABLET (100 MG TOTAL) BY MOUTH DAILY. NEEDS OFFICE VISIT 90 tablet 0   Turmeric 1053 MG TABS turmeric     No current facility-administered medications on file prior to visit.   Allergies  Allergen Reactions   Penicillins    Social History   Socioeconomic History   Marital status: Married    Spouse name: Not on file   Number of children: Not on file   Years of education: Not on file   Highest education level: Not on file  Occupational History   Not on file  Tobacco Use   Smoking status: Never   Smokeless tobacco: Never  Substance and Sexual Activity   Alcohol use: No   Drug use: No   Sexual activity: Yes    Comment: Married to [place]verWilliam.  Daughter in 4th year of medical school.  Other Topics Concern   Not on file  Social History Narrative   Not on file   Social Determinants of Health   Financial Resource Strain: Not on file  Food Insecurity: Not on file  Transportation Needs: Not on file  Physical Activity: Not on file  Stress: Not on file  Social Connections: Not on file  Intimate Partner Violence: Not on file  Daughter is MD at [employer] Other daughter is Psychologist, educationaltrainer in orthopedics at [employer] [place] Family History  Problem Relation Age of Onset   Colon polyps Mother    Hyperlipidemia Father    Heart disease Father    Hyperlipidemia Sister    Hyperlipidemia Brother    CVA Brother       Review of Systems  All other systems reviewed and are negative.     Objective:   Physical Exam Vitals reviewed.  Constitutional:      General: She is not in acute distress.    Appearance: She is well-developed. She is not diaphoretic.  HENT:     Head: Normocephalic and  atraumatic.     Right Ear: External ear  normal.     Left Ear: External ear normal.     Nose: Nose normal.     Mouth/Throat:     Pharynx: No oropharyngeal exudate.  Eyes:     General: No scleral icterus.       Right eye: No discharge.        Left eye: No discharge.     Conjunctiva/sclera: Conjunctivae normal.     Pupils: Pupils are equal, round, and reactive to light.  Neck:     Thyroid: No thyromegaly.     Vascular: No JVD.  Cardiovascular:     Rate and Rhythm: Normal rate and regular rhythm.     Heart sounds: Normal heart sounds. No murmur heard.   No friction rub. No gallop.  Pulmonary:     Effort: Pulmonary effort is normal. No respiratory distress.     Breath sounds: Normal breath sounds. No stridor. No wheezing or rales.  Chest:     Chest wall: No tenderness.  Abdominal:     General: Bowel sounds are normal. There is no distension.     Palpations: Abdomen is soft. There is no mass.     Tenderness: There is no abdominal tenderness. There is no guarding or rebound.  Musculoskeletal:        General: No tenderness. Normal range of motion.     Cervical back: Normal range of motion and neck supple.  Lymphadenopathy:     Cervical: No cervical adenopathy.  Skin:    General: Skin is warm.     Coloration: Skin is not pale.     Findings: No erythema or rash.  Neurological:     Mental Status: She is alert and oriented to person, place, and time.     Cranial Nerves: No cranial nerve deficit.     Motor: No abnormal muscle tone.     Coordination: Coordination normal.     Deep Tendon Reflexes: Reflexes are normal and symmetric.  Psychiatric:        Behavior: Behavior normal.        Thought Content: Thought content normal.        Judgment: Judgment normal.          Assessment & Plan:  Colon cancer screening - Plan: Ambulatory referral to Gastroenterology  Routine general medical examination at a health care facility Overall the patient is doing very well.  Her cholesterol  slightly elevated.  She does have a family history of a brother with heart disease.  However her father lived to be in his late 46s and her mother is still living and her sisters do not have any heart disease.  Therefore she elects against any medical treatment for her cholesterol and prefers to work on diet and exercise and will try fish oil 2000 mg daily.  I recommended the shingles vaccine, COVID booster, and flu shot.  I will schedule her for colonoscopy.  I will defer her mammogram and her Pap smear to her gynecologist.

## 2022-05-27 DIAGNOSIS — Z01419 Encounter for gynecological examination (general) (routine) without abnormal findings: Secondary | ICD-10-CM | POA: Diagnosis not present

## 2022-05-27 DIAGNOSIS — Z1231 Encounter for screening mammogram for malignant neoplasm of breast: Secondary | ICD-10-CM | POA: Diagnosis not present

## 2022-05-27 DIAGNOSIS — F419 Anxiety disorder, unspecified: Secondary | ICD-10-CM | POA: Insufficient documentation

## 2022-05-27 DIAGNOSIS — Z124 Encounter for screening for malignant neoplasm of cervix: Secondary | ICD-10-CM | POA: Diagnosis not present

## 2022-05-27 DIAGNOSIS — Z6825 Body mass index (BMI) 25.0-25.9, adult: Secondary | ICD-10-CM | POA: Diagnosis not present

## 2022-06-05 DIAGNOSIS — F419 Anxiety disorder, unspecified: Secondary | ICD-10-CM | POA: Diagnosis not present

## 2022-06-05 DIAGNOSIS — Z8371 Family history of colonic polyps: Secondary | ICD-10-CM | POA: Diagnosis not present

## 2022-06-05 DIAGNOSIS — Z1211 Encounter for screening for malignant neoplasm of colon: Secondary | ICD-10-CM | POA: Diagnosis not present

## 2022-06-25 ENCOUNTER — Other Ambulatory Visit: Payer: Self-pay

## 2022-06-25 MED ORDER — SERTRALINE HCL 100 MG PO TABS: 100.0000 mg | ORAL_TABLET | Freq: Every day | ORAL | 0 refills | Status: DC

## 2022-06-27 DIAGNOSIS — Z1211 Encounter for screening for malignant neoplasm of colon: Secondary | ICD-10-CM | POA: Diagnosis not present

## 2022-06-27 LAB — HM COLONOSCOPY

## 2022-07-31 ENCOUNTER — Encounter: Payer: Self-pay | Admitting: Family Medicine

## 2022-09-22 ENCOUNTER — Other Ambulatory Visit: Payer: Self-pay | Admitting: Family Medicine

## 2022-11-20 ENCOUNTER — Other Ambulatory Visit: Payer: Self-pay

## 2022-11-20 DIAGNOSIS — F411 Generalized anxiety disorder: Secondary | ICD-10-CM

## 2022-11-20 MED ORDER — SERTRALINE HCL 100 MG PO TABS: ORAL_TABLET | ORAL | 3 refills | Status: DC

## 2023-06-22 ENCOUNTER — Other Ambulatory Visit: Payer: BC Managed Care – PPO

## 2023-06-22 DIAGNOSIS — Z0000 Encounter for general adult medical examination without abnormal findings: Secondary | ICD-10-CM

## 2023-06-22 DIAGNOSIS — R5383 Other fatigue: Secondary | ICD-10-CM

## 2023-06-22 DIAGNOSIS — Z1211 Encounter for screening for malignant neoplasm of colon: Secondary | ICD-10-CM

## 2023-06-22 DIAGNOSIS — Z13220 Encounter for screening for lipoid disorders: Secondary | ICD-10-CM | POA: Diagnosis not present

## 2023-06-22 LAB — CBC WITH DIFFERENTIAL/PLATELET
Absolute Monocytes: 442 cells/uL (ref 200–950)
Basophils Absolute: 41 cells/uL (ref 0–200)
Basophils Relative: 0.6 %
Eosinophils Absolute: 197 cells/uL (ref 15–500)
Eosinophils Relative: 2.9 %
HCT: 42.6 % (ref 35.0–45.0)
Hemoglobin: 14.1 g/dL (ref 11.7–15.5)
Lymphs Abs: 2162 cells/uL (ref 850–3900)
MCH: 30.4 pg (ref 27.0–33.0)
MCHC: 33.1 g/dL (ref 32.0–36.0)
MCV: 91.8 fL (ref 80.0–100.0)
MPV: 8.7 fL (ref 7.5–12.5)
Monocytes Relative: 6.5 %
Neutro Abs: 3958 cells/uL (ref 1500–7800)
Neutrophils Relative %: 58.2 %
Platelets: 245 10*3/uL (ref 140–400)
RBC: 4.64 10*6/uL (ref 3.80–5.10)
RDW: 13.1 % (ref 11.0–15.0)
Total Lymphocyte: 31.8 %
WBC: 6.8 10*3/uL (ref 3.8–10.8)

## 2023-06-22 LAB — COMPLETE METABOLIC PANEL WITH GFR
AG Ratio: 1.8 (calc) (ref 1.0–2.5)
ALT: 17 U/L (ref 6–29)
AST: 18 U/L (ref 10–35)
Albumin: 4.6 g/dL (ref 3.6–5.1)
Alkaline phosphatase (APISO): 72 U/L (ref 37–153)
BUN: 21 mg/dL (ref 7–25)
CO2: 32 mmol/L (ref 20–32)
Calcium: 9.7 mg/dL (ref 8.6–10.4)
Chloride: 102 mmol/L (ref 98–110)
Creat: 0.78 mg/dL (ref 0.50–1.05)
Globulin: 2.5 g/dL (calc) (ref 1.9–3.7)
Glucose, Bld: 96 mg/dL (ref 65–99)
Potassium: 4.2 mmol/L (ref 3.5–5.3)
Sodium: 141 mmol/L (ref 135–146)
Total Bilirubin: 0.4 mg/dL (ref 0.2–1.2)
Total Protein: 7.1 g/dL (ref 6.1–8.1)
eGFR: 86 mL/min/{1.73_m2} (ref >=60)

## 2023-06-22 LAB — LIPID PANEL
Cholesterol: 219 mg/dL — ABNORMAL HIGH (ref <200)
HDL: 70 mg/dL (ref >=50)
LDL Cholesterol (Calc): 122 mg/dL (calc) — ABNORMAL HIGH
Non-HDL Cholesterol (Calc): 149 mg/dL (calc) — ABNORMAL HIGH (ref <130)
Total CHOL/HDL Ratio: 3.1 (calc) (ref <5.0)
Triglycerides: 153 mg/dL — ABNORMAL HIGH (ref <150)

## 2023-06-26 ENCOUNTER — Encounter: Payer: Self-pay | Admitting: Family Medicine

## 2023-06-26 ENCOUNTER — Ambulatory Visit: Payer: BC Managed Care – PPO | Admitting: Family Medicine

## 2023-06-26 VITALS — BP 120/62 | HR 65 | Temp 97.8°F | Ht 64.0 in | Wt 155.0 lb

## 2023-06-26 DIAGNOSIS — Z0001 Encounter for general adult medical examination with abnormal findings: Secondary | ICD-10-CM | POA: Diagnosis not present

## 2023-06-26 DIAGNOSIS — E785 Hyperlipidemia, unspecified: Secondary | ICD-10-CM | POA: Diagnosis not present

## 2023-06-26 DIAGNOSIS — Z0000 Encounter for general adult medical examination without abnormal findings: Secondary | ICD-10-CM

## 2023-06-26 NOTE — Progress Notes (Signed)
Subjective:    Patient ID: Chelsea Richmond, female    DOB: Jun 28, 1961, 62 y.o.   MRN: [id number]  HPI Patient is a very pleasant 62 year old white female who is here today for complete physical exam.  He denies any medical concerns.  Had colonoscopy 7/23 which was normal and good for 10 years.   She has made an appointment to see her gynecologist who performs her Pap smears and mammograms.  Her most recent lab work is listed below Lab on 06/22/2023  Component Date Value Ref Range Status   WBC 06/22/2023 6.8  3.8 - 10.8 Thousand/uL Final   RBC 06/22/2023 4.64  3.80 - 5.10 Million/uL Final   Hemoglobin 06/22/2023 14.1  11.7 - 15.5 g/dL Final   HCT 40/98/1191 42.6  35.0 - 45.0 % Final   MCV 06/22/2023 91.8  80.0 - 100.0 fL Final   MCH 06/22/2023 30.4  27.0 - 33.0 pg Final   MCHC 06/22/2023 33.1  32.0 - 36.0 g/dL Final   RDW 47/82/9562 13.1  11.0 - 15.0 % Final   Platelets 06/22/2023 245  140 - 400 Thousand/uL Final   MPV 06/22/2023 8.7  7.5 - 12.5 fL Final   Neutro Abs 06/22/2023 3,958  1,500 - 7,800 cells/uL Final   Lymphs Abs 06/22/2023 2,162  850 - 3,900 cells/uL Final   Absolute Monocytes 06/22/2023 442  200 - 950 cells/uL Final   Eosinophils Absolute 06/22/2023 197  15 - 500 cells/uL Final   Basophils Absolute 06/22/2023 41  0 - 200 cells/uL Final   Neutrophils Relative % 06/22/2023 58.2  % Final   Total Lymphocyte 06/22/2023 31.8  % Final   Monocytes Relative 06/22/2023 6.5  % Final   Eosinophils Relative 06/22/2023 2.9  % Final   Basophils Relative 06/22/2023 0.6  % Final   Glucose, Bld 06/22/2023 96  65 - 99 mg/dL Final   Comment: .            Fasting reference interval .    BUN 06/22/2023 21  7 - 25 mg/dL Final   Creat 13/07/6577 0.78  0.50 - 1.05 mg/dL Final   eGFR 46/96/2952 86  > OR = 60 mL/min/1.28m2 Final   BUN/Creatinine Ratio 06/22/2023 SEE NOTE:  6 - 22 (calc) Final   Comment:    Not Reported: BUN and Creatinine are within    reference range. .    Sodium  06/22/2023 141  135 - 146 mmol/L Final   Potassium 06/22/2023 4.2  3.5 - 5.3 mmol/L Final   Chloride 06/22/2023 102  98 - 110 mmol/L Final   CO2 06/22/2023 32  20 - 32 mmol/L Final   Calcium 06/22/2023 9.7  8.6 - 10.4 mg/dL Final   Total Protein 84/13/2440 7.1  6.1 - 8.1 g/dL Final   Albumin 10/11/2535 4.6  3.6 - 5.1 g/dL Final   Globulin 64/40/3474 2.5  1.9 - 3.7 g/dL (calc) Final   AG Ratio 06/22/2023 1.8  1.0 - 2.5 (calc) Final   Total Bilirubin 06/22/2023 0.4  0.2 - 1.2 mg/dL Final   Alkaline phosphatase (APISO) 06/22/2023 72  37 - 153 U/L Final   AST 06/22/2023 18  10 - 35 U/L Final   ALT 06/22/2023 17  6 - 29 U/L Final   Cholesterol 06/22/2023 219 (H)  <200 mg/dL Final   HDL 25/95/6387 70  > OR = 50 mg/dL Final   Triglycerides 56/43/3295 153 (H)  <150 mg/dL Final   LDL Cholesterol (Calc) 06/22/2023 122 (H)  mg/dL (calc) Final   Comment: Reference range: <100 . Desirable range <100 mg/dL for primary prevention;   <70 mg/dL for patients with CHD or diabetic patients  with > or = 2 CHD risk factors. Marland Kitchen LDL-C is now calculated using the Martin-Hopkins  calculation, which is a validated novel method providing  better accuracy than the Friedewald equation in the  estimation of LDL-C.  Horald Pollen et al. Lenox Ahr. 3664;403(47): 2061-2068  (http://education.[facility].com/faq/FAQ164)    Total CHOL/HDL Ratio 06/22/2023 3.1  <4.2 (calc) Final   Non-HDL Cholesterol (Calc) 06/22/2023 149 (H)  <130 mg/dL (calc) Final   Comment: For patients with diabetes plus 1 major ASCVD risk  factor, treating to a non-HDL-C goal of <100 mg/dL  (LDL-C of <59 mg/dL) is considered a therapeutic  option.    Past Medical History:  Diagnosis Date   Hard of hearing    Internal hemorrhoid    Current Outpatient Medications on File Prior to Visit  Medication Sig Dispense Refill   aspirin 81 MG chewable tablet Chew 81 mg by mouth daily.     cholecalciferol (VITAMIN D3) 25 MCG (1000 UNIT) tablet Take  1,000 Units by mouth daily.     Cyanocobalamin (VITAMIN B 12 PO) Take by mouth.     fish oil-omega-3 fatty acids 1000 MG capsule Take 2 g by mouth daily.     Misc Natural Products (NEURIVA PO) Take by mouth.     Multiple Vitamin (MULTIVITAMIN) tablet Take 1 tablet by mouth daily.     sertraline (ZOLOFT) 100 MG tablet TAKE 1 TABLET BY MOUTH EVERY DAY 90 tablet 3   Turmeric 1053 MG TABS turmeric     No current facility-administered medications on file prior to visit.   Allergies  Allergen Reactions   Penicillins    Social History   Socioeconomic History   Marital status: Married    Spouse name: Not on file   Number of children: Not on file   Years of education: Not on file   Highest education level: Not on file  Occupational History   Not on file  Tobacco Use   Smoking status: Never   Smokeless tobacco: Never  Substance and Sexual Activity   Alcohol use: No   Drug use: No   Sexual activity: Yes    Comment: Married to [place].  Daughter in 4th year of medical school.  Other Topics Concern   Not on file  Social History Narrative   Not on file   Social Determinants of Health   Financial Resource Strain: Not on file  Food Insecurity: Not on file  Transportation Needs: Not on file  Physical Activity: Not on file  Stress: Not on file  Social Connections: Not on file  Intimate Partner Violence: Not on file  Daughter is MD at [employer] Other daughter is Psychologist, educational in orthopedics at [employer] Family History  Problem Relation Age of Onset   Colon polyps Mother    Hyperlipidemia Father    Heart disease Father    Hyperlipidemia Sister    Hyperlipidemia Brother    CVA Brother       Review of Systems  All other systems reviewed and are negative.      Objective:   Physical Exam Vitals reviewed.  Constitutional:      General: She is not in acute distress.    Appearance: She is well-developed. She is not diaphoretic.  HENT:     Head: Normocephalic and  atraumatic.     Right  Ear: External ear normal.     Left Ear: External ear normal.     Nose: Nose normal.     Mouth/Throat:     Pharynx: No oropharyngeal exudate.  Eyes:     General: No scleral icterus.       Right eye: No discharge.        Left eye: No discharge.     Conjunctiva/sclera: Conjunctivae normal.     Pupils: Pupils are equal, round, and reactive to light.  Neck:     Thyroid: No thyromegaly.     Vascular: No JVD.  Cardiovascular:     Rate and Rhythm: Normal rate and regular rhythm.     Heart sounds: Normal heart sounds. No murmur heard.    No friction rub. No gallop.  Pulmonary:     Effort: Pulmonary effort is normal. No respiratory distress.     Breath sounds: Normal breath sounds. No stridor. No wheezing or rales.  Chest:     Chest wall: No tenderness.  Abdominal:     General: Bowel sounds are normal. There is no distension.     Palpations: Abdomen is soft. There is no mass.     Tenderness: There is no abdominal tenderness. There is no guarding or rebound.  Musculoskeletal:        General: No tenderness. Normal range of motion.     Cervical back: Normal range of motion and neck supple.  Lymphadenopathy:     Cervical: No cervical adenopathy.  Skin:    General: Skin is warm.     Coloration: Skin is not pale.     Findings: No erythema or rash.  Neurological:     Mental Status: She is alert and oriented to person, place, and time.     Cranial Nerves: No cranial nerve deficit.     Motor: No abnormal muscle tone.     Coordination: Coordination normal.     Deep Tendon Reflexes: Reflexes are normal and symmetric.  Psychiatric:        Behavior: Behavior normal.        Thought Content: Thought content normal.        Judgment: Judgment normal.           Assessment & Plan:  Dyslipidemia - Plan: CT CARDIAC SCORING (SELF PAY ONLY)  Routine general medical examination at a health care facility Patient's cancer screening is up-to-date.  Mammogram is pending.  My  only concern is her mild hyperlipidemia on her lab work.  Her father and brother both had a history of cardiovascular disease.  Her father had a heart attack and her brother had strokes..  For this reason I will to risk stratify the patient with a cardiac score to determine if she would benefit from statin therapy.  Patient agrees.  Immunizations are up-to-date

## 2023-06-30 DIAGNOSIS — Z01419 Encounter for gynecological examination (general) (routine) without abnormal findings: Secondary | ICD-10-CM | POA: Diagnosis not present

## 2023-06-30 DIAGNOSIS — Z1231 Encounter for screening mammogram for malignant neoplasm of breast: Secondary | ICD-10-CM | POA: Diagnosis not present

## 2023-06-30 DIAGNOSIS — Z6826 Body mass index (BMI) 26.0-26.9, adult: Secondary | ICD-10-CM | POA: Diagnosis not present

## 2023-07-08 DIAGNOSIS — Z13220 Encounter for screening for lipoid disorders: Secondary | ICD-10-CM | POA: Insufficient documentation

## 2023-07-10 ENCOUNTER — Ambulatory Visit (HOSPITAL_COMMUNITY)
Admission: RE | Admit: 2023-07-10 | Discharge: 2023-07-10 | Disposition: A | Discharge status: Home / Self Care | Payer: Self-pay | Source: Ambulatory Visit | Attending: Family Medicine | Admitting: Family Medicine

## 2023-07-10 DIAGNOSIS — E785 Hyperlipidemia, unspecified: Secondary | ICD-10-CM | POA: Insufficient documentation

## 2023-07-21 ENCOUNTER — Encounter: Payer: Self-pay | Admitting: Family Medicine

## 2023-12-01 ENCOUNTER — Other Ambulatory Visit: Payer: Self-pay

## 2023-12-01 ENCOUNTER — Telehealth: Payer: Self-pay

## 2023-12-01 DIAGNOSIS — F411 Generalized anxiety disorder: Secondary | ICD-10-CM

## 2023-12-01 MED ORDER — SERTRALINE HCL 100 MG PO TABS: ORAL_TABLET | ORAL | 3 refills | Status: DC

## 2023-12-01 NOTE — Telephone Encounter (Signed)
Prescription Request  12/01/2023  LOV: 06/26/23  What is the name of the medication or equipment? sertraline (ZOLOFT) 100 MG tablet [[id number]]   Have you contacted your pharmacy to request a refill? Yes   Which pharmacy would you like this sent to?  [id number] - [place], [place] - [address] AT [facility] [address] [place] [address] Phone: [phone] Fax: [phone]    Patient notified that their request is being sent to the clinical staff for review and that they should receive a response within 2 business days.   Please advise at [facility] [phone]

## 2024-01-05 DIAGNOSIS — H16423 Pannus (corneal), bilateral: Secondary | ICD-10-CM | POA: Diagnosis not present

## 2024-01-12 DIAGNOSIS — H16143 Punctate keratitis, bilateral: Secondary | ICD-10-CM | POA: Diagnosis not present

## 2024-01-26 DIAGNOSIS — H16143 Punctate keratitis, bilateral: Secondary | ICD-10-CM | POA: Diagnosis not present

## 2024-03-01 DIAGNOSIS — H16143 Punctate keratitis, bilateral: Secondary | ICD-10-CM | POA: Diagnosis not present

## 2024-06-04 DIAGNOSIS — S60512A Abrasion of left hand, initial encounter: Secondary | ICD-10-CM | POA: Diagnosis not present

## 2024-06-04 DIAGNOSIS — S0121XA Laceration without foreign body of nose, initial encounter: Secondary | ICD-10-CM | POA: Diagnosis not present

## 2024-06-04 DIAGNOSIS — S80211A Abrasion, right knee, initial encounter: Secondary | ICD-10-CM | POA: Diagnosis not present

## 2024-06-04 DIAGNOSIS — Y9355 Activity, bike riding: Secondary | ICD-10-CM | POA: Diagnosis not present

## 2024-06-04 DIAGNOSIS — S80212A Abrasion, left knee, initial encounter: Secondary | ICD-10-CM | POA: Diagnosis not present

## 2024-06-04 DIAGNOSIS — Y998 Other external cause status: Secondary | ICD-10-CM | POA: Diagnosis not present

## 2024-06-04 DIAGNOSIS — S60511A Abrasion of right hand, initial encounter: Secondary | ICD-10-CM | POA: Diagnosis not present

## 2024-07-18 ENCOUNTER — Encounter: Payer: Self-pay | Admitting: Family Medicine

## 2024-07-25 ENCOUNTER — Other Ambulatory Visit

## 2024-07-25 DIAGNOSIS — E785 Hyperlipidemia, unspecified: Secondary | ICD-10-CM

## 2024-07-26 ENCOUNTER — Ambulatory Visit: Payer: Self-pay | Admitting: Family Medicine

## 2024-07-26 LAB — CBC WITH DIFFERENTIAL/PLATELET
Absolute Lymphocytes: 2124 {cells}/uL (ref 850–3900)
Absolute Monocytes: 389 {cells}/uL (ref 200–950)
Basophils Absolute: 47 {cells}/uL (ref 0–200)
Basophils Relative: 0.8 %
Eosinophils Absolute: 189 {cells}/uL (ref 15–500)
Eosinophils Relative: 3.2 %
HCT: 42.3 % (ref 35.0–45.0)
Hemoglobin: 13.8 g/dL (ref 11.7–15.5)
MCH: 30.5 pg (ref 27.0–33.0)
MCHC: 32.6 g/dL (ref 32.0–36.0)
MCV: 93.4 fL (ref 80.0–100.0)
MPV: 9.1 fL (ref 7.5–12.5)
Monocytes Relative: 6.6 %
Neutro Abs: 3151 {cells}/uL (ref 1500–7800)
Neutrophils Relative %: 53.4 %
Platelets: 234 Thousand/uL (ref 140–400)
RBC: 4.53 Million/uL (ref 3.80–5.10)
RDW: 13.5 % (ref 11.0–15.0)
Total Lymphocyte: 36 %
WBC: 5.9 Thousand/uL (ref 3.8–10.8)

## 2024-07-26 LAB — LIPID PANEL
Cholesterol: 238 mg/dL — ABNORMAL HIGH (ref <200)
HDL: 65 mg/dL (ref >=50)
LDL Cholesterol (Calc): 152 mg/dL — ABNORMAL HIGH
Non-HDL Cholesterol (Calc): 173 mg/dL — ABNORMAL HIGH (ref <130)
Total CHOL/HDL Ratio: 3.7 (calc) (ref <5.0)
Triglycerides: 99 mg/dL (ref <150)

## 2024-07-26 LAB — COMPLETE METABOLIC PANEL WITHOUT GFR
AG Ratio: 1.8 (calc) (ref 1.0–2.5)
ALT: 16 U/L (ref 6–29)
AST: 16 U/L (ref 10–35)
Albumin: 4.4 g/dL (ref 3.6–5.1)
Alkaline phosphatase (APISO): 59 U/L (ref 37–153)
BUN: 20 mg/dL (ref 7–25)
CO2: 30 mmol/L (ref 20–32)
Calcium: 9.5 mg/dL (ref 8.6–10.4)
Chloride: 104 mmol/L (ref 98–110)
Creat: 0.72 mg/dL (ref 0.50–1.05)
Globulin: 2.5 g/dL (ref 1.9–3.7)
Glucose, Bld: 95 mg/dL (ref 65–99)
Potassium: 4 mmol/L (ref 3.5–5.3)
Sodium: 141 mmol/L (ref 135–146)
Total Bilirubin: 0.4 mg/dL (ref 0.2–1.2)
Total Protein: 6.9 g/dL (ref 6.1–8.1)

## 2024-08-02 ENCOUNTER — Encounter: Payer: Self-pay | Admitting: Family Medicine

## 2024-08-02 ENCOUNTER — Ambulatory Visit: Admitting: Family Medicine

## 2024-08-02 VITALS — BP 120/80 | HR 65 | Temp 97.5°F | Ht 64.0 in | Wt 153.2 lb

## 2024-08-02 DIAGNOSIS — Z0001 Encounter for general adult medical examination with abnormal findings: Secondary | ICD-10-CM

## 2024-08-02 DIAGNOSIS — Z0000 Encounter for general adult medical examination without abnormal findings: Secondary | ICD-10-CM

## 2024-08-02 DIAGNOSIS — Z124 Encounter for screening for malignant neoplasm of cervix: Secondary | ICD-10-CM

## 2024-08-02 DIAGNOSIS — E785 Hyperlipidemia, unspecified: Secondary | ICD-10-CM

## 2024-08-02 DIAGNOSIS — Z1231 Encounter for screening mammogram for malignant neoplasm of breast: Secondary | ICD-10-CM

## 2024-08-02 DIAGNOSIS — Z23 Encounter for immunization: Secondary | ICD-10-CM | POA: Diagnosis not present

## 2024-08-02 NOTE — Addendum Note (Signed)
 Addended by: ANGELENA RONAL BRADLEY K on: 08/02/2024 05:16 PM   Modules accepted: Orders

## 2024-08-02 NOTE — Progress Notes (Signed)
 Subjective:    Patient ID: Chelsea Richmond, female    DOB: [birth year], 63 y.o.   MRN: [id number]  HPI Patient is a very pleasant 63 year old white female who is here today for complete physical exam.  She denies any medical concerns.  Had colonoscopy 7/23 which was normal and good for 10 years.     Her most recent lab work is listed below: She does have what appears to be an inflamed skin tag on her posterior right shoulder.  It is roughly 3 mm in diameter.  It has a pedunculated base and a hyperkeratotic erythematous wartlike papule.  We discussed her options and she would like to proceed with liquid nitrogen cryotherapy to destroy it.  She is scheduling her mammogram and her Pap smear with her gynecologist.  She is due for a pneumonia vaccine.  The shingles vaccine is up-to-date.  She is due for a flu shot and a COVID shot. Lab on 07/25/2024  Component Date Value Ref Range Status   WBC 07/25/2024 5.9  3.8 - 10.8 Thousand/uL Final   RBC 07/25/2024 4.53  3.80 - 5.10 Million/uL Final   Hemoglobin 07/25/2024 13.8  11.7 - 15.5 g/dL Final   HCT 91/88/7974 42.3  35.0 - 45.0 % Final   MCV 07/25/2024 93.4  80.0 - 100.0 fL Final   MCH 07/25/2024 30.5  27.0 - 33.0 pg Final   MCHC 07/25/2024 32.6  32.0 - 36.0 g/dL Final   Comment: For adults, a slight decrease in the calculated MCHC value (in the range of 30 to 32 g/dL) is most likely not clinically significant; however, it should be interpreted with caution in correlation with other red cell parameters and the patient's clinical condition.    RDW 07/25/2024 13.5  11.0 - 15.0 % Final   Platelets 07/25/2024 234  140 - 400 Thousand/uL Final   MPV 07/25/2024 9.1  7.5 - 12.5 fL Final   Neutro Abs 07/25/2024 3,151  1,500 - 7,800 cells/uL Final   Absolute Lymphocytes 07/25/2024 2,124  850 - 3,900 cells/uL Final   Absolute Monocytes 07/25/2024 389  200 - 950 cells/uL Final   Eosinophils Absolute 07/25/2024 189  15 - 500 cells/uL Final   Basophils  Absolute 07/25/2024 47  0 - 200 cells/uL Final   Neutrophils Relative % 07/25/2024 53.4  % Final   Total Lymphocyte 07/25/2024 36.0  % Final   Monocytes Relative 07/25/2024 6.6  % Final   Eosinophils Relative 07/25/2024 3.2  % Final   Basophils Relative 07/25/2024 0.8  % Final   Glucose, Bld 07/25/2024 95  65 - 99 mg/dL Final   Comment: .            Fasting reference interval .    BUN 07/25/2024 20  7 - 25 mg/dL Final   Creat 91/88/7974 0.72  0.50 - 1.05 mg/dL Final   BUN/Creatinine Ratio 07/25/2024 SEE NOTE:  6 - 22 (calc) Final   Comment:    Not Reported: BUN and Creatinine are within    reference range. .    Sodium 07/25/2024 141  135 - 146 mmol/L Final   Potassium 07/25/2024 4.0  3.5 - 5.3 mmol/L Final   Chloride 07/25/2024 104  98 - 110 mmol/L Final   CO2 07/25/2024 30  20 - 32 mmol/L Final   Calcium 07/25/2024 9.5  8.6 - 10.4 mg/dL Final   Total Protein 91/88/7974 6.9  6.1 - 8.1 g/dL Final   Albumin 91/88/7974 4.4  3.6 - 5.1  g/dL Final   Globulin 91/88/7974 2.5  1.9 - 3.7 g/dL (calc) Final   AG Ratio 07/25/2024 1.8  1.0 - 2.5 (calc) Final   Total Bilirubin 07/25/2024 0.4  0.2 - 1.2 mg/dL Final   Alkaline phosphatase (APISO) 07/25/2024 59  37 - 153 U/L Final   AST 07/25/2024 16  10 - 35 U/L Final   ALT 07/25/2024 16  6 - 29 U/L Final   Cholesterol 07/25/2024 238 (H)  <200 mg/dL Final   HDL 91/88/7974 65  > OR = 50 mg/dL Final   Triglycerides 91/88/7974 99  <150 mg/dL Final   LDL Cholesterol (Calc) 07/25/2024 152 (H)  mg/dL (calc) Final   Comment: Reference range: <100 . Desirable range <100 mg/dL for primary prevention;   <70 mg/dL for patients with CHD or diabetic patients  with > or = 2 CHD risk factors. SABRA LDL-C is now calculated using the Martin-Hopkins  calculation, which is a validated novel method providing  better accuracy than the Friedewald equation in the  estimation of LDL-C.  Gladis APPLETHWAITE et al. SANDREA. 7986;689(80): 2061-2068   (http://education.[facility].com/faq/FAQ164)    Total CHOL/HDL Ratio 07/25/2024 3.7  <4.9 (calc) Final   Non-HDL Cholesterol (Calc) 07/25/2024 173 (H)  <130 mg/dL (calc) Final   Comment: For patients with diabetes plus 1 major ASCVD risk  factor, treating to a non-HDL-C goal of <100 mg/dL  (LDL-C of <29 mg/dL) is considered a therapeutic  option.    Past Medical History:  Diagnosis Date   Hard of hearing    Internal hemorrhoid    Current Outpatient Medications on File Prior to Visit  Medication Sig Dispense Refill   aspirin 81 MG chewable tablet Chew 81 mg by mouth daily.     cholecalciferol (VITAMIN D3) 25 MCG (1000 UNIT) tablet Take 1,000 Units by mouth daily.     Cyanocobalamin (VITAMIN B 12 PO) Take by mouth.     fish oil-omega-3 fatty acids 1000 MG capsule Take 2 g by mouth daily.     Multiple Vitamin (MULTIVITAMIN) tablet Take 1 tablet by mouth daily.     Polyethylene Glycol 400 (BLINK TEARS OP) Apply 400 mg to eye.     sertraline  (ZOLOFT ) 100 MG tablet TAKE 1 TABLET BY MOUTH EVERY DAY 90 tablet 3   Turmeric 1053 MG TABS turmeric     Misc Natural Products (NEURIVA PO) Take by mouth. (Patient not taking: Reported on 08/02/2024)     No current facility-administered medications on file prior to visit.   Allergies  Allergen Reactions   Penicillins    Social History   Socioeconomic History   Marital status: Married    Spouse name: Not on file   Number of children: Not on file   Years of education: Not on file   Highest education level: Not on file  Occupational History   Not on file  Tobacco Use   Smoking status: Never   Smokeless tobacco: Never  Substance and Sexual Activity   Alcohol use: No   Drug use: No   Sexual activity: Yes    Comment: Married to [place].  Daughter in 4th year of medical school.  Other Topics Concern   Not on file  Social History Narrative   Not on file   Social Drivers of Health   Financial Resource Strain: Not on file  Food  Insecurity: Not on file  Transportation Needs: Not on file  Physical Activity: Not on file  Stress: Not on file  Social Connections:  Not on file  Intimate Partner Violence: Not on file  Daughter is MD at [employer] Other daughter is trainer in orthopedics at [employer] Family History  Problem Relation Age of Onset   Colon polyps Mother    Hyperlipidemia Father    Heart disease Father    Hyperlipidemia Sister    Hyperlipidemia Brother    CVA Brother       Review of Systems  All other systems reviewed and are negative.      Objective:   Physical Exam Vitals reviewed.  Constitutional:      General: She is not in acute distress.    Appearance: She is well-developed. She is not diaphoretic.  HENT:     Head: Normocephalic and atraumatic.     Right Ear: External ear normal.     Left Ear: External ear normal.     Nose: Nose normal.     Mouth/Throat:     Pharynx: No oropharyngeal exudate.  Eyes:     General: No scleral icterus.       Right eye: No discharge.        Left eye: No discharge.     Conjunctiva/sclera: Conjunctivae normal.     Pupils: Pupils are equal, round, and reactive to light.  Neck:     Thyroid: No thyromegaly.     Vascular: No JVD.  Cardiovascular:     Rate and Rhythm: Normal rate and regular rhythm.     Heart sounds: Normal heart sounds. No murmur heard.    No friction rub. No gallop.  Pulmonary:     Effort: Pulmonary effort is normal. No respiratory distress.     Breath sounds: Normal breath sounds. No stridor. No wheezing or rales.  Chest:     Chest wall: No tenderness.  Abdominal:     General: Bowel sounds are normal. There is no distension.     Palpations: Abdomen is soft. There is no mass.     Tenderness: There is no abdominal tenderness. There is no guarding or rebound.  Musculoskeletal:        General: No tenderness. Normal range of motion.     Cervical back: Normal range of motion and neck supple.  Lymphadenopathy:     Cervical: No  cervical adenopathy.  Skin:    General: Skin is warm.     Coloration: Skin is not pale.     Findings: No erythema or rash.  Neurological:     Mental Status: She is alert and oriented to person, place, and time.     Cranial Nerves: No cranial nerve deficit.     Motor: No abnormal muscle tone.     Coordination: Coordination normal.     Deep Tendon Reflexes: Reflexes are normal and symmetric.  Psychiatric:        Behavior: Behavior normal.        Thought Content: Thought content normal.        Judgment: Judgment normal.           Assessment & Plan:  Routine general medical examination at a health care facility  Dyslipidemia  Encounter for screening mammogram for malignant neoplasm of breast - Plan: CANCELED: MM 3D SCREENING MAMMOGRAM BILATERAL BREAST Patient's cholesterol has risen substantially.  However her coronary artery calcium score was 0.  We have elected to try to treat this with diet and exercise.  I treated the inflamed skin tag with liquid nitrogen cryotherapy for 30 seconds.  I will not charge the patient for  this.  The remainder of her lab work is excellent.  She received Prevnar 20.  Recommended a flu shot and a COVID shot this fall.  Her colonoscopy is up-to-date.  I recommended she see her gynecologist for her mammogram and Pap smear.

## 2024-09-27 DIAGNOSIS — Z1231 Encounter for screening mammogram for malignant neoplasm of breast: Secondary | ICD-10-CM | POA: Diagnosis not present

## 2024-09-27 DIAGNOSIS — Z01419 Encounter for gynecological examination (general) (routine) without abnormal findings: Secondary | ICD-10-CM | POA: Diagnosis not present

## 2024-09-30 ENCOUNTER — Other Ambulatory Visit: Payer: Self-pay | Admitting: Obstetrics and Gynecology

## 2024-09-30 DIAGNOSIS — R928 Other abnormal and inconclusive findings on diagnostic imaging of breast: Secondary | ICD-10-CM

## 2024-10-10 ENCOUNTER — Ambulatory Visit

## 2024-10-10 ENCOUNTER — Ambulatory Visit
Admission: RE | Admit: 2024-10-10 | Discharge: 2024-10-10 | Disposition: A | Discharge status: Home / Self Care | Source: Ambulatory Visit | Attending: Obstetrics and Gynecology | Admitting: Obstetrics and Gynecology

## 2024-10-10 DIAGNOSIS — R928 Other abnormal and inconclusive findings on diagnostic imaging of breast: Secondary | ICD-10-CM | POA: Diagnosis not present

## 2024-11-12 ENCOUNTER — Other Ambulatory Visit: Payer: Self-pay | Admitting: Family Medicine

## 2024-11-12 DIAGNOSIS — F411 Generalized anxiety disorder: Secondary | ICD-10-CM
